# Patient Record
Sex: Male | Born: 1960 | Race: White | Hispanic: No | Marital: Married | Smoking: Current every day smoker
Health system: Southern US, Community
[De-identification: ages and names within clinical notes are randomized; demographics above are authoritative.]

## PROBLEM LIST (undated history)

## (undated) DIAGNOSIS — N529 Male erectile dysfunction, unspecified: Secondary | ICD-10-CM

## (undated) DIAGNOSIS — J339 Nasal polyp, unspecified: Secondary | ICD-10-CM

## (undated) DIAGNOSIS — I10 Essential (primary) hypertension: Secondary | ICD-10-CM

## (undated) DIAGNOSIS — K219 Gastro-esophageal reflux disease without esophagitis: Secondary | ICD-10-CM

## (undated) DIAGNOSIS — D320 Benign neoplasm of cerebral meninges: Secondary | ICD-10-CM

## (undated) HISTORY — DX: Male erectile dysfunction, unspecified: N52.9

## (undated) HISTORY — DX: Essential (primary) hypertension: I10

## (undated) HISTORY — DX: Gastro-esophageal reflux disease without esophagitis: K21.9

## (undated) HISTORY — PX: OTHER SURGICAL HISTORY: SHX169

## (undated) HISTORY — DX: Benign neoplasm of cerebral meninges: D32.0

## (undated) HISTORY — DX: Nasal polyp, unspecified: J33.9

---

## 1998-01-22 ENCOUNTER — Ambulatory Visit (HOSPITAL_COMMUNITY): Admission: RE | Admit: 1998-01-22 | Discharge: 1998-01-22 | Payer: Self-pay | Admitting: *Deleted

## 1998-01-22 ENCOUNTER — Encounter: Payer: Self-pay | Admitting: Ophthalmology

## 1998-10-18 ENCOUNTER — Encounter: Payer: Self-pay | Admitting: Internal Medicine

## 1998-10-18 ENCOUNTER — Ambulatory Visit (HOSPITAL_COMMUNITY): Admission: RE | Admit: 1998-10-18 | Discharge: 1998-10-18 | Payer: Self-pay | Admitting: Internal Medicine

## 1999-05-10 ENCOUNTER — Ambulatory Visit (HOSPITAL_COMMUNITY): Admission: RE | Admit: 1999-05-10 | Discharge: 1999-05-10 | Payer: Self-pay | Admitting: Unknown Physician Specialty

## 1999-05-10 ENCOUNTER — Encounter: Payer: Self-pay | Admitting: Unknown Physician Specialty

## 2000-05-12 ENCOUNTER — Encounter: Payer: Self-pay | Admitting: Unknown Physician Specialty

## 2000-05-12 ENCOUNTER — Ambulatory Visit (HOSPITAL_COMMUNITY): Admission: RE | Admit: 2000-05-12 | Discharge: 2000-05-12 | Payer: Self-pay | Admitting: Unknown Physician Specialty

## 2001-05-11 ENCOUNTER — Ambulatory Visit (HOSPITAL_COMMUNITY): Admission: RE | Admit: 2001-05-11 | Discharge: 2001-05-11 | Payer: Self-pay | Admitting: Unknown Physician Specialty

## 2001-05-11 ENCOUNTER — Encounter: Payer: Self-pay | Admitting: Unknown Physician Specialty

## 2002-10-29 ENCOUNTER — Ambulatory Visit (HOSPITAL_COMMUNITY): Admission: RE | Admit: 2002-10-29 | Discharge: 2002-10-29 | Payer: Self-pay | Admitting: Unknown Physician Specialty

## 2002-10-29 ENCOUNTER — Encounter: Payer: Self-pay | Admitting: Unknown Physician Specialty

## 2003-10-18 ENCOUNTER — Ambulatory Visit (HOSPITAL_COMMUNITY): Admission: RE | Admit: 2003-10-18 | Discharge: 2003-10-18 | Payer: Self-pay | Admitting: Internal Medicine

## 2003-12-31 ENCOUNTER — Ambulatory Visit (HOSPITAL_COMMUNITY): Admission: RE | Admit: 2003-12-31 | Discharge: 2003-12-31 | Payer: Self-pay | Admitting: Internal Medicine

## 2004-03-27 ENCOUNTER — Ambulatory Visit: Payer: Self-pay | Admitting: Internal Medicine

## 2004-05-03 ENCOUNTER — Ambulatory Visit: Payer: Self-pay | Admitting: Internal Medicine

## 2005-01-29 ENCOUNTER — Ambulatory Visit: Payer: Self-pay | Admitting: Internal Medicine

## 2005-02-20 ENCOUNTER — Ambulatory Visit: Payer: Self-pay

## 2005-09-19 ENCOUNTER — Ambulatory Visit: Payer: Self-pay | Admitting: Professional

## 2005-12-19 ENCOUNTER — Ambulatory Visit: Payer: Self-pay | Admitting: Professional

## 2007-01-09 ENCOUNTER — Telehealth: Payer: Self-pay | Admitting: Internal Medicine

## 2007-02-18 ENCOUNTER — Encounter: Payer: Self-pay | Admitting: Internal Medicine

## 2007-07-16 ENCOUNTER — Encounter: Payer: Self-pay | Admitting: Internal Medicine

## 2008-06-09 ENCOUNTER — Telehealth: Payer: Self-pay | Admitting: Internal Medicine

## 2008-07-07 ENCOUNTER — Encounter: Payer: Self-pay | Admitting: Internal Medicine

## 2008-08-16 ENCOUNTER — Ambulatory Visit: Payer: Self-pay | Admitting: Internal Medicine

## 2008-08-16 LAB — CONVERTED CEMR LAB
ALT: 16 units/L (ref 0–53)
AST: 18 units/L (ref 0–37)
Albumin: 4.3 g/dL (ref 3.5–5.2)
Alkaline Phosphatase: 76 units/L (ref 39–117)
BUN: 7 mg/dL (ref 6–23)
Basophils Absolute: 0.1 10*3/uL (ref 0.0–0.1)
Bilirubin, Direct: 0.2 mg/dL (ref 0.0–0.3)
CO2: 30 meq/L (ref 19–32)
Calcium: 9.2 mg/dL (ref 8.4–10.5)
Chloride: 106 meq/L (ref 96–112)
Cholesterol: 216 mg/dL — ABNORMAL HIGH (ref 0–200)
Direct LDL: 123 mg/dL
Eosinophils Absolute: 0.2 10*3/uL (ref 0.0–0.7)
Glucose, Bld: 91 mg/dL (ref 70–99)
HDL: 90.7 mg/dL (ref >39.00)
Hemoglobin: 14.5 g/dL (ref 13.0–17.0)
Ketones, ur: NEGATIVE mg/dL
Lymphs Abs: 1.6 10*3/uL (ref 0.7–4.0)
Neutro Abs: 4 10*3/uL (ref 1.4–7.7)
Nitrite: NEGATIVE
PSA: 0.59 ng/mL (ref 0.10–4.00)
RBC: 4.38 M/uL (ref 4.22–5.81)
TSH: 1.01 microintl units/mL (ref 0.35–5.50)
Total Bilirubin: 1.3 mg/dL — ABNORMAL HIGH (ref 0.3–1.2)
Urobilinogen, UA: 0.2 (ref 0.0–1.0)
WBC: 6.4 10*3/uL (ref 4.5–10.5)

## 2008-08-23 ENCOUNTER — Ambulatory Visit: Payer: Self-pay | Admitting: Internal Medicine

## 2008-09-01 DIAGNOSIS — J330 Polyp of nasal cavity: Secondary | ICD-10-CM | POA: Insufficient documentation

## 2008-09-01 DIAGNOSIS — D320 Benign neoplasm of cerebral meninges: Secondary | ICD-10-CM | POA: Insufficient documentation

## 2009-03-20 ENCOUNTER — Encounter (INDEPENDENT_AMBULATORY_CARE_PROVIDER_SITE_OTHER): Payer: Self-pay | Admitting: *Deleted

## 2009-03-23 ENCOUNTER — Telehealth: Payer: Self-pay | Admitting: Internal Medicine

## 2010-01-17 ENCOUNTER — Encounter (INDEPENDENT_AMBULATORY_CARE_PROVIDER_SITE_OTHER): Payer: Self-pay | Admitting: *Deleted

## 2010-02-05 ENCOUNTER — Encounter (INDEPENDENT_AMBULATORY_CARE_PROVIDER_SITE_OTHER): Payer: Self-pay

## 2010-02-06 ENCOUNTER — Ambulatory Visit: Payer: Self-pay | Admitting: Internal Medicine

## 2010-02-20 ENCOUNTER — Ambulatory Visit: Payer: Self-pay | Admitting: Internal Medicine

## 2010-02-21 ENCOUNTER — Encounter: Payer: Self-pay | Admitting: Internal Medicine

## 2010-04-12 NOTE — Miscellaneous (Signed)
Summary: Lec previsit  Clinical Lists Changes  Medications: Added new medication of MOVIPREP 100 GM  SOLR (PEG-KCL-NACL-NASULF-NA ASC-C) As per prep instructions. - Signed Rx of MOVIPREP 100 GM  SOLR (PEG-KCL-NACL-NASULF-NA ASC-C) As per prep instructions.;  #1 x 0;  Signed;  Entered by: Ulis Rias RN;  Authorized by: Hilarie Fredrickson MD;  Method used: Electronically to [facility] # [address] [place], [place]  [address], Ph: [phone], Fax: [phone] Observations: Added new observation of ALLERGY REV: Done (02/06/2010 10:49)    Prescriptions: MOVIPREP 100 GM  SOLR (PEG-KCL-NACL-NASULF-NA ASC-C) As per prep instructions.  #1 x 0   Entered by:   Ulis Rias RN   Authorized by:   Hilarie Fredrickson MD   Signed by:   Ulis Rias RN on 02/06/2010   Method used:   Electronically to        [facility] # [address]* (retail)       [address]       [place], [place]  [address]       Ph: [phone]       Fax: [phone]   RxID:   [id number]

## 2010-04-12 NOTE — Progress Notes (Signed)
  Phone Note Refill Request Message from:  Fax from Pharmacy on March 23, 2009 2:34 PM  Refills Requested: Medication #1:  NEXIUM 40 MG CPDR 1 qam   Last Refilled: 06/09/2008  Medication #2:  FLONASE 50 MCG/ACT SUSP 2 spr qd.   Last Refilled: 06/09/2008 Initial call taken by: Ami Bullins CMA,  March 23, 2009 2:34 PM    Prescriptions: FLONASE 50 MCG/ACT SUSP (FLUTICASONE PROPIONATE) 2 spr qd  #90 x 3   Entered by:   Ami Bullins CMA   Authorized by:   Jacques Navy MD   Signed by:   Bill Salinas CMA on 03/23/2009   Method used:   Electronically to        [facility]* (mail-order)             ,          Ph: [phone]       Fax: [phone]   RxID:   [id number] NEXIUM 40 MG CPDR (ESOMEPRAZOLE MAGNESIUM) 1 qam  #90 x 3   Entered by:   Bill Salinas CMA   Authorized by:   Jacques Navy MD   Signed by:   Bill Salinas CMA on 03/23/2009   Method used:   Electronically to        [facility]* (mail-order)             ,          Ph: [phone]       Fax: [phone]   RxID:   [id number]

## 2010-04-12 NOTE — Procedures (Signed)
Summary: Colonoscopy  Patient: Exavier Lina Note: All result statuses are Final unless otherwise noted.  Tests: (1) Colonoscopy (COL)   COL Colonoscopy           DONE     [facility]     [address]     [place], [place]  [address]           COLONOSCOPY PROCEDURE REPORT           PATIENT:  Hershy, Flenner  MR#:  [id number]     BIRTHDATE:  September 06, 1960, 49 yrs. old  GENDER:  male     ENDOSCOPIST:  Wilhemina Bonito. Eda Keys, MD     REF. BY:  Rosalyn Gess. Norins, M.D.     PROCEDURE DATE:  02/20/2010     PROCEDURE:  Colonoscopy with snare polypectomy X 3     ASA CLASS:  Class II     INDICATIONS:  Routine Risk Screening     MEDICATIONS:   Fentanyl 100 mcg IV, Versed 10 mg IV, Benadryl 37.5     mg IV           DESCRIPTION OF PROCEDURE:   After the risks benefits and     alternatives of the procedure were thoroughly explained, informed     consent was obtained.  Digital rectal exam was performed and     revealed no abnormalities.   The LB CF-[id number] endoscope     was introduced through the anus and advanced to the cecum, which     was identified by both the appendix and ileocecal valve, without     limitations.TIME TO CECUM = 3:03 MIN.   The quality of the prep     was excellent, using MoviPrep.  The instrument was then slowly     withdrawn (T= 12:12 MIN)as the colon was fully examined.     <<PROCEDUREIMAGES>>           FINDINGS:  Three polyps were found- 3mm in cecum, 5mm in ascending     colon, and 3mm in the sigmoid colon. Polyps were snared without     cautery. Retrieval was successful.  This was otherwise a normal     examination of the colon.   Retroflexed views in the rectum     revealed no abnormalities.    The scope was then withdrawn from     the patient and the procedure completed.           COMPLICATIONS:  None           ENDOSCOPIC IMPRESSION:     1) Three Adenomatous appearring polyps - removed     2) Otherwise normal examination           RECOMMENDATIONS:  1) Follow up colonoscopy in 3 years           ______________________________     Wilhemina Bonito. Eda Keys, MD           CC:  Jacques Navy, MD; The Patient           n.     eSIGNED:   Wilhemina Bonito. Eda Keys at 02/20/2010 10:24 AM           Harlon Flor, [id number]  Note: An exclamation Roberth (!) indicates a result that was not dispersed into the flowsheet. Document Creation Date: 02/20/2010 10:24 AM _______________________________________________________________________  (1) Order result status: Final Collection or observation date-time: 02/20/2010 10:14 Requested date-time:  Receipt date-time:  Reported date-time:  Referring Physician:   Ordering Physician: Fransico Setters [phone]) Specimen Source:  Source: Launa Grill Order Number: [id number] Lab site:

## 2010-04-12 NOTE — Letter (Signed)
Summary: [facility] Instructions  [facility]  [address] [place], [place] [address]   Phone: [phone]  Fax: [phone]       GUSTAVO DISPENZA    1960-07-17    MRN: [id number]        Procedure Day /Date:  02/20/10   Tuesday     Arrival Time: 9:00am      Procedure Time:  10:00am     Location of Procedure:                    _x _  [facility] (4th Floor)                        PREPARATION FOR COLONOSCOPY WITH MOVIPREP   Starting 5 days prior to your procedure _12/8/11 _ do not eat nuts, seeds, popcorn, corn, beans, peas,  salads, or any raw vegetables.  Do not take any fiber supplements (e.g. Metamucil, Citrucel, and Benefiber).  THE DAY BEFORE YOUR PROCEDURE         DATE:  02/19/10   DAY: Monday  1.  Drink clear liquids the entire day-NO SOLID FOOD  2.  Do not drink anything colored red or purple.  Avoid juices with pulp.  No orange juice.  3.  Drink at least 64 oz. (8 glasses) of fluid/clear liquids during the day to prevent dehydration and help the prep work efficiently.  CLEAR LIQUIDS INCLUDE: Water Jello Ice Popsicles Tea (sugar ok, no milk/cream) Powdered fruit flavored drinks Coffee (sugar ok, no milk/cream) Gatorade Juice: apple, white grape, white cranberry  Lemonade Clear bullion, consomm, broth Carbonated beverages (any kind) Strained chicken noodle soup Hard Candy                             4.  In the morning, mix first dose of MoviPrep solution:    Empty 1 Pouch A and 1 Pouch B into the disposable container    Add lukewarm drinking water to the top line of the container. Mix to dissolve    Refrigerate (mixed solution should be used within 24 hrs)  5.  Begin drinking the prep at 5:00 p.m. The MoviPrep container is divided by 4 marks.   Every 15 minutes drink the solution down to the next Toy (approximately 8 oz) until the full liter is complete.   6.  Follow completed prep with 16 oz of clear liquid of your choice  (Nothing red or purple).  Continue to drink clear liquids until bedtime.  7.  Before going to bed, mix second dose of MoviPrep solution:    Empty 1 Pouch A and 1 Pouch B into the disposable container    Add lukewarm drinking water to the top line of the container. Mix to dissolve    Refrigerate  THE DAY OF YOUR PROCEDURE      DATE:  02/20/10  DAY:  Tuesday  Beginning at  5:00 a.m. (5 hours before procedure):         1. Every 15 minutes, drink the solution down to the next [place] (approx 8 oz) until the full liter is complete.  2. Follow completed prep with 16 oz. of clear liquid of your choice.    3. You may drink clear liquids until  8:00am  (2 HOURS BEFORE PROCEDURE).   MEDICATION INSTRUCTIONS  Unless otherwise instructed, you should take regular prescription medications with a small sip  of water   as early as possible the morning of your procedure.         OTHER INSTRUCTIONS  You will need a responsible adult at least 52 years of age to accompany you and drive you home.   This person must remain in the waiting room during your procedure.  Wear loose fitting clothing that is easily removed.  Leave jewelry and other valuables at home.  However, you may wish to bring a book to read or  an iPod/MP3 player to listen to music as you wait for your procedure to start.  Remove all body piercing jewelry and leave at home.  Total time from sign-in until discharge is approximately 2-3 hours.  You should go home directly after your procedure and rest.  You can resume normal activities the  day after your procedure.  The day of your procedure you should not:   Drive   Make legal decisions   Operate machinery   Drink alcohol   Return to work  You will receive specific instructions about eating, activities and medications before you leave.    The above instructions have been reviewed and explained to me by   Ulis Rias RN  February 06, 2010 11:18 AM     I fully  understand and can verbalize these instructions _____________________________ Date _________

## 2010-04-12 NOTE — Letter (Signed)
Summary: Pre Visit Letter Revised  [facility]  [address] [place], [place] [address]   Phone: [phone]  Fax: [phone]      01/17/2010 MRN: [id number]     Colin Watson [address] [place], [place]  [address]   Procedure Date:  02/20/10  Welcome to the Gastroenterology Division at [facility].    You are scheduled to see a nurse for your pre-procedure visit on TUESDAY, 02/06/10 at 11:00 A.M. on the 3rd floor at [facility], [address]  We ask that you try to arrive at our office 15 minutes prior to your appointment time to allow for check-in.  Please take a minute to review the attached form.  If you answer "Yes" to one or more of the questions on the first page, we ask that you call the person listed at your earliest opportunity.  If you answer "No" to all of the questions, please complete the rest of the form and bring it to your appointment.    Your nurse visit will consist of discussing your medical and surgical history, your immediate family medical history, and your medications.   If you are unable to list all of your medications on the form, please bring the medication bottles to your appointment and we will list them.  We will need to be aware of both prescribed and over the counter drugs.  We will need to know exact dosage information as well.    Please be prepared to read and sign documents such as consent forms, a financial agreement, and acknowledgement forms.  If necessary, and with your consent, a friend or relative is welcome to sit-in on the nurse visit with you.  Please bring your insurance card so that we may make a copy of it.  If your insurance requires a referral to see a specialist, please bring your referral form from your primary care physician.  No co-pay is required for this nurse visit.     If you cannot keep your appointment, please call [phone] to cancel or reschedule prior to your appointment date.  This  allows [place] the opportunity to schedule an appointment for another patient in need of care.    Thank you for choosing [facility] for your medical needs.  We appreciate the opportunity to care for you.  Please visit [place] at our website  to learn more about our practice.  Sincerely, The Gastroenterology Division

## 2010-04-12 NOTE — Letter (Signed)
Summary: Patient Notice- Polyp Results  [facility]  [address] [place], [place] [address]   Phone: [phone]  Fax: [phone]        February 21, 2010 MRN: [id number]    [facility] [address] [place], [place]  [address]    Dear Colin Watson,  I am pleased to inform you that the colon polyp(s) removed during your recent colonoscopy was (were) found to be benign (no cancer detected) upon pathologic examination.  I recommend you have a repeat colonoscopy examination in 3 years to look for recurrent polyps, as having colon polyps increases your risk for having recurrent polyps or even colon cancer in the future.  Should you develop new or worsening symptoms of abdominal pain, bowel habit changes or bleeding from the rectum or bowels, please schedule an evaluation with either your primary care physician or with me.  Additional information/recommendations:  __ No further action with gastroenterology is needed at this time. Please      follow-up with your primary care physician for your other healthcare      needs.    Please call us if you are having persistent problems or have questions about your condition that have not been fully answered at this time.  Sincerely,  Colin Fredrickson MD  This letter has been electronically signed by your physician.  Appended Document: Patient Notice- Polyp Results Letter mailed

## 2010-04-12 NOTE — Letter (Signed)
Summary: [facility] Referral (unable to schedule) Notification  [facility]  [address] [place], [place] [address]   Phone: [phone]  Fax: [phone]      March 20, 2009 Colin Watson 01/14/1961 MRN: [id number]   [facility] [address] [place], [place]  [address]   Dear Dr. Debby Bud:   Thank you for your kind referral of the above patient. We have attempted to schedule the recommended Colonoscopy but have been unable to schedule because:  _x_ The patient was not available by phone and/or has not returned our calls.  __ The patient declined to schedule the procedure at this time.  We appreciate the referral and hope that we will have the opportunity to treat this patient in the future.    Sincerely,   [facility]  Vania Rea. Jarold Motto M.D. Hedwig Morton. Juanda Chance M.D. Venita Lick. Russella Dar M.D. Wilhemina Bonito. Marina Goodell M.D. Barbette Hair. Arlyce Dice M.D. Iva Boop M.D. Cheron Every.D.

## 2010-04-16 ENCOUNTER — Encounter: Payer: Self-pay | Admitting: Internal Medicine

## 2010-04-16 ENCOUNTER — Ambulatory Visit (INDEPENDENT_AMBULATORY_CARE_PROVIDER_SITE_OTHER): Payer: BC Managed Care – PPO | Admitting: Internal Medicine

## 2010-04-16 DIAGNOSIS — J209 Acute bronchitis, unspecified: Secondary | ICD-10-CM

## 2010-04-26 NOTE — Assessment & Plan Note (Signed)
Summary: head -- chest cold   Vital Signs:  Patient profile:   50 year old male Height:      71 inches Weight:      201 pounds BMI:     28.14 O2 Sat:      98 % on Room air Temp:     98.1 degrees F oral Pulse rate:   81 / minute BP sitting:   124 / 88  (left arm) Cuff size:   large  Vitals Entered By: Bill Salinas CMA (April 16, 2010 4:52 PM)  O2 Flow:  Room air CC: pt here with c/o head congestion x 3 weeks   Primary Care Provider:  Jacques Navy MD  CC:  pt here with c/o head congestion x 3 weeks.  History of Present Illness: Patient has had a lot of congestion in the chest, did have some sinus congestion but this cleared. Has cough productive colored phlegm with bad taste, greenish color. MIld SOB. No fever, no hard chills. No N/V/D.  Taking Mucinex D and this does help some.   Current Medications (verified): 1)  Nexium 40 Mg Cpdr (Esomeprazole Magnesium) .Marland Kitchen.. 1 Qam 2)  Flonase 50 Mcg/act Susp (Fluticasone Propionate) .... 2 Spr Qd  Allergies (verified): 1)  ! 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  The patient denies anorexia, fever, weight loss, weight gain, hoarseness, chest pain, peripheral edema, abdominal pain, severe indigestion/heartburn, difficulty walking, and unusual weight change.    Physical Exam  General:  alert, well-developed, well-nourished, and well-hydrated.   Head:  normocephalic and atraumatic.  Minimal tenderness to percussion over maxillary sinus Eyes:  C&S clear Ears:  External ear exam shows no significant lesions or deformities.  Otoscopic examination reveals clear canals, tympanic membranes are intact bilaterally without bulging,  retraction, inflammation or discharge. Hearing is grossly normal bilaterally. Mouth:  posterior pharynx is normal Neck:  supple.   Lungs:  normal respiratory effort, normal breath sounds, no crackles, and no wheezes.   Heart:  normal rate and regular rhythm.   Skin:  turgor normal and color normal.   Cervical Nodes:  no anterior cervical adenopathy and no posterior cervical adenopathy.   Psych:  Oriented X3, normally interactive, and good eye contact.     Impression & Recommendations:  Problem # 1:  BRONCHITIS-ACUTE (ICD-466.0) Persistent symptoms for several weeks with a scant amount of thick colored sputum. Suspect mild bronchitis  Plan - doxycycline 100mg  two times a day           supportive care: mucinex palin, or robitussin DM, hydrate, rest, ([place])  His updated medication list for this problem includes:    Doxycycline Hyclate 100 Mg Caps (Doxycycline hyclate) .Marland Kitchen... 1 by mouth two times a day for 7 dyas for uri  Complete Medication List: 1)  Nexium 40 Mg Cpdr (Esomeprazole magnesium) .Marland Kitchen.. 1 qam 2)  Flonase 50 Mcg/act Susp (Fluticasone propionate) .... 2 spr qd 3)  Doxycycline Hyclate 100 Mg Caps (Doxycycline hyclate) .Marland Kitchen.. 1 by mouth two times a day for 7 dyas for uri Prescriptions: DOXYCYCLINE HYCLATE 100 MG CAPS (DOXYCYCLINE HYCLATE) 1 by mouth two times a day for 7 dyas for URI  #14 x 0  Entered and Authorized by:   Jacques Navy MD   Signed by:   Jacques Navy MD on 04/16/2010   Method used:   Electronically to        [facility] # [address] (retail)       [address]       [place], [place]  [address]       Ph: [phone]       Fax: [phone]   RxID:   [id number]    Orders Added: 1)  Est. Patient Level III [84132]

## 2010-07-27 NOTE — Letter (Signed)
[facility]                                BEHAVIORAL MEDICINE   DATE:10/10/2005  TO:Illene Regulus, MD  XB:[id number], [place] E    #[id number]    The above-named patient was referred to behavioral medicine for evaluation  and possible ongoing psychotherapy.  He was seen on September 19, 2005.  Thank  you for your referral and continued interest in behavioral medicine.     Royal Hawthorn, PhD   CG/MedQ  DD:  10/10/2005  DT:  10/10/2005  Job #:  [id number]

## 2010-08-20 ENCOUNTER — Telehealth: Payer: Self-pay | Admitting: *Deleted

## 2010-08-20 MED ORDER — TADALAFIL 5 MG PO TABS: 5.0000 mg | ORAL_TABLET | Freq: Every day | ORAL | Status: DC

## 2010-08-20 NOTE — Telephone Encounter (Signed)
Done,  Patient informed

## 2010-08-20 NOTE — Telephone Encounter (Signed)
Ok for cialis 5mg  sig 1 po qd, #30 refill 5. Need pharmacy

## 2010-08-20 NOTE — Telephone Encounter (Signed)
Pt left vm, he sent MD email and has not heard back. I advised pt that med requests are best handled thru triage. He is req rx for cialis daily.

## 2010-10-19 ENCOUNTER — Other Ambulatory Visit: Payer: Self-pay

## 2010-10-19 MED ORDER — TADALAFIL 5 MG PO TABS: 5.0000 mg | ORAL_TABLET | Freq: Every day | ORAL | Status: DC

## 2010-10-19 NOTE — Telephone Encounter (Signed)
Per pt [facility] Rx need to be filled through mail order company

## 2010-12-27 ENCOUNTER — Other Ambulatory Visit: Payer: Self-pay | Admitting: Internal Medicine

## 2011-01-12 ENCOUNTER — Other Ambulatory Visit: Payer: Self-pay | Admitting: Internal Medicine

## 2011-06-18 ENCOUNTER — Other Ambulatory Visit: Payer: Self-pay | Admitting: Internal Medicine

## 2011-12-02 ENCOUNTER — Other Ambulatory Visit (INDEPENDENT_AMBULATORY_CARE_PROVIDER_SITE_OTHER): Payer: BC Managed Care – PPO

## 2011-12-02 ENCOUNTER — Encounter: Payer: Self-pay | Admitting: Internal Medicine

## 2011-12-02 ENCOUNTER — Ambulatory Visit (INDEPENDENT_AMBULATORY_CARE_PROVIDER_SITE_OTHER): Payer: BC Managed Care – PPO | Admitting: Internal Medicine

## 2011-12-02 VITALS — BP 162/100 | HR 79 | Temp 97.4°F | Resp 16 | Wt 190.0 lb

## 2011-12-02 DIAGNOSIS — D320 Benign neoplasm of cerebral meninges: Secondary | ICD-10-CM

## 2011-12-02 DIAGNOSIS — N529 Male erectile dysfunction, unspecified: Secondary | ICD-10-CM

## 2011-12-02 DIAGNOSIS — Z0000 Encounter for general adult medical examination without abnormal findings: Secondary | ICD-10-CM

## 2011-12-02 MED ORDER — TADALAFIL 20 MG PO TABS: 20.0000 mg | ORAL_TABLET | ORAL | Status: DC | PRN

## 2011-12-02 NOTE — Patient Instructions (Addendum)
Normal exam. You are good for two years and prescriptions will be refilled in the interval.  You will receive a copy of the report but you may be able to access your labs on MyChart.

## 2011-12-03 ENCOUNTER — Encounter: Payer: Self-pay | Admitting: Internal Medicine

## 2011-12-03 DIAGNOSIS — N529 Male erectile dysfunction, unspecified: Secondary | ICD-10-CM | POA: Insufficient documentation

## 2011-12-03 DIAGNOSIS — Z0000 Encounter for general adult medical examination without abnormal findings: Secondary | ICD-10-CM | POA: Insufficient documentation

## 2011-12-03 LAB — COMPREHENSIVE METABOLIC PANEL
ALT: 21 U/L (ref 0–53)
Chloride: 101 mEq/L (ref 96–112)
GFR: 75.19 mL/min (ref >60.00)
Glucose, Bld: 87 mg/dL (ref 70–99)
Total Protein: 7.4 g/dL (ref 6.0–8.3)

## 2011-12-03 LAB — LDL CHOLESTEROL, DIRECT: Direct LDL: 110.3 mg/dL

## 2011-12-03 LAB — LIPID PANEL
HDL: 98.6 mg/dL (ref >39.00)
Total CHOL/HDL Ratio: 2
Triglycerides: 89 mg/dL (ref 0.0–149.0)

## 2011-12-03 NOTE — Progress Notes (Signed)
Subjective:    Patient ID: Colin Watson, male    DOB: 07/15/1960, 51 y.o.   MRN: [id number]  HPI Mr.Obyrne presents for annual medical exam and follow-up. He was last seen February '12. In the interval he has been doing well: no major illness, surgery or injury. He has followed at [facility] for meningioma with serial MRI scans and has been stable. He has seen his dentist, he is current with ophthalmology. He remains fit, exercising on a regular basis. He has not medical complaints.  Past Medical History  Diagnosis Date  . 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 Followed at [facility]  . Nasal polyp    Past Surgical History  Procedure Date  . Cyst excision,wrist    Family History  Problem Relation Age of Onset  . COPD Neg Hx   . Diabetes Neg Hx   . Heart disease Neg Hx   . Cancer Neg Hx    History   Social History  . Marital Status: Married    Spouse Name: N/A    Number of Children: 2  . Years of Education: 16   Occupational History  . ACCOUNTANT    Social History Main Topics  . Smoking status: Current Every Day Smoker -- 1.0 packs/day for 33 years    Types: Cigarettes  . Smokeless tobacco: Never Used   Comment: discussed approach to smoking cessation and behavioral change  . Alcohol Use: Yes  . Drug Use: No  . Sexually Active: Yes -- Male partner(s)   Other Topics Concern  . Not on file   Social History Narrative   HSG, [employer] - BA accounting. Married '82. 1 daughter, 1 son. 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 Marriage in good health    Current Outpatient Prescriptions on File Prior to Visit  Medication Sig Dispense Refill  . fluticasone (FLONASE) 50 MCG/ACT nasal spray USE 2 SPRAYS DAILY AS DIRECTED  3 g  5  . NEXIUM 40 MG capsule TAKE 1 CAPSULE EVERY MORNING  90 capsule  0  . tadalafil (CIALIS) 20 MG tablet Take 1 tablet (20 mg total) by mouth every other day as needed for erectile  dysfunction.  12 tablet  3             Review of Systems Constitutional:  Negative for fever, chills, activity change and unexpected weight change.  HEENT:  Negative for hearing loss, ear pain, congestion, neck stiffness and postnasal drip. Negative for sore throat or swallowing problems. Negative for dental complaints.   Eyes: Negative for vision loss or change in visual acuity.  Respiratory: Negative for chest tightness and wheezing. Negative for DOE.   Cardiovascular: Negative for chest pain or palpitations. No decreased exercise tolerance Gastrointestinal: No change in bowel habit. No bloating or gas. No reflux or indigestion Genitourinary: Negative for urgency, frequency, flank pain and difficulty urinating.  Musculoskeletal: Negative for myalgias, back pain, arthralgias and gait problem.  Neurological: Negative for dizziness, tremors, weakness and headaches.  Hematological: Negative for adenopathy.  Psychiatric/Behavioral: Negative for behavioral problems and dysphoric mood.       Objective:   Physical Exam Filed Vitals:   12/02/11 1510  BP: 162/100  Pulse: 79  Temp: 97.4 F (36.3 C)  Resp: 16   Wt Readings from Last 3 Encounters:  12/02/11 190 lb (86.183 kg)  04/16/10 201 lb (91.173 kg)  08/23/08 187 lb 8 oz (85.049 kg)   Gen'l:  Well nourished well developed white male in no acute distress  HEENT: Head: Normocephalic and atraumatic. Right Ear: External ear normal. EAC/TM nl. Left Ear: External ear normal.  EAC/TM nl. Nose: Nose normal. Mouth/Throat: Oropharynx is clear and moist. Dentition - native, in good repair. No buccal or palatal lesions. Posterior pharynx clear. Eyes: Conjunctivae and sclera clear. EOM intact. Right pupil opacified - blind OD. Left pupil round, and reactive to light.  Left eye exhibits no discharge. Neck: Normal range of motion. Neck supple. No JVD present. No tracheal deviation present. No thyromegaly present.  Cardiovascular: Normal rate,  regular rhythm, no gallop, no friction rub, no murmur heard.      Quiet precordium. 2+ radial and DP pulses . No carotid bruits Pulmonary/Chest: Effort normal. No respiratory distress or increased WOB, no wheezes, no rales. No chest wall deformity or CVAT. Abdomen: Soft. Bowel sounds are normal in all quadrants. He exhibits no distension, no tenderness, no rebound or guarding, No heptosplenomegaly  Genitourinary: deferred  Musculoskeletal: Normal range of motion. He exhibits no edema and no tenderness.       Small and large joints without redness, synovial thickening or deformity. Full range of motion preserved about all small, median and large joints.  Lymphadenopathy:    He has no cervical or supraclavicular adenopathy.  Neurological: He is alert and oriented to person, place, and time. CN II-XII intact except for blind in right eye. DTRs 2+ and symmetrical biceps, radial and patellar tendons. Cerebellar function normal with no tremor, rigidity, normal gait and station.  Skin: Skin is warm and dry. No rash noted. No erythema.  Psychiatric: He has a normal mood and affect. His behavior is normal. Thought content normal.   Routine labwork pending       Assessment & Plan:

## 2011-12-03 NOTE — Assessment & Plan Note (Signed)
Mild ED - responds to medication  Plan - Rx Cialis 20 mg prn

## 2011-12-03 NOTE — Assessment & Plan Note (Signed)
Optic nerve right. Followed at [facility] - stable for several years based on MRI scans.

## 2011-12-03 NOTE — Assessment & Plan Note (Signed)
Interval history is benign. Physical exam is normal sans genitalia/prostate exam. Lab is pending but reviewed lab '10 - excellent with normal LDL cholesterol, very robust HDL cholesterol, normal glucose and PSA 0.59. He is current with colorectal cancer screening and due for follow-up in '14. Immunizations - he had full immunization [place] to travel abroad - he will forward immunization record. Discussed pros and cons of prostate cancer screening (USPHCTF recommendations reviewed and ACU April '13 recommendations) and he defers evaluation at this time.  In summary - a very nice man who appears to be healthy with a stable optic nerve sheath meningioma. He does invest in his health with regular exercise. He is advised to return in 2 years for exam and may have medication refills w/o OV in the interval.

## 2012-01-16 ENCOUNTER — Other Ambulatory Visit: Payer: Self-pay | Admitting: Internal Medicine

## 2012-02-19 ENCOUNTER — Other Ambulatory Visit: Payer: Self-pay

## 2012-02-19 MED ORDER — FLUTICASONE PROPIONATE 50 MCG/ACT NA SUSP: 2.0000 | Freq: Every day | NASAL | Status: DC

## 2012-12-30 ENCOUNTER — Encounter: Payer: Self-pay | Admitting: Internal Medicine

## 2013-01-01 ENCOUNTER — Other Ambulatory Visit: Payer: Self-pay | Admitting: Internal Medicine

## 2013-03-26 ENCOUNTER — Other Ambulatory Visit: Payer: Self-pay | Admitting: Internal Medicine

## 2013-08-07 ENCOUNTER — Encounter: Payer: Self-pay | Admitting: Internal Medicine

## 2013-09-17 ENCOUNTER — Other Ambulatory Visit: Payer: Self-pay | Admitting: Dermatology

## 2013-09-23 ENCOUNTER — Ambulatory Visit (INDEPENDENT_AMBULATORY_CARE_PROVIDER_SITE_OTHER): Payer: BC Managed Care – PPO | Admitting: Internal Medicine

## 2013-09-23 ENCOUNTER — Encounter: Payer: Self-pay | Admitting: Internal Medicine

## 2013-09-23 VITALS — BP 185/101 | HR 84 | Temp 98.2°F | Wt 206.0 lb

## 2013-09-23 DIAGNOSIS — I10 Essential (primary) hypertension: Secondary | ICD-10-CM

## 2013-09-23 DIAGNOSIS — K219 Gastro-esophageal reflux disease without esophagitis: Secondary | ICD-10-CM | POA: Insufficient documentation

## 2013-09-23 DIAGNOSIS — IMO0001 Reserved for inherently not codable concepts without codable children: Secondary | ICD-10-CM

## 2013-09-23 DIAGNOSIS — R030 Elevated blood-pressure reading, without diagnosis of hypertension: Secondary | ICD-10-CM

## 2013-09-23 DIAGNOSIS — N529 Male erectile dysfunction, unspecified: Secondary | ICD-10-CM

## 2013-09-23 HISTORY — DX: Essential (primary) hypertension: I10

## 2013-09-23 MED ORDER — ESOMEPRAZOLE MAGNESIUM 40 MG PO CPDR: DELAYED_RELEASE_CAPSULE | ORAL | Status: DC

## 2013-09-23 MED ORDER — FLUTICASONE PROPIONATE 50 MCG/ACT NA SUSP
NASAL | Status: DC
Start: 2013-09-23 — End: 2014-10-18

## 2013-09-23 MED ORDER — TADALAFIL 20 MG PO TABS: 20.0000 mg | ORAL_TABLET | ORAL | Status: DC | PRN

## 2013-09-23 NOTE — Assessment & Plan Note (Signed)
BP noted to be elevated today and in 2013. He is asymptomatic Discussed labs and medications but states he had labs at work yearly . Eventually agreed  on: Patient to bring lab results from work Monitor BPs, call with readings in 2 weeks, medication? Low-salt diet, exercise

## 2013-09-23 NOTE — Patient Instructions (Signed)
Check the  blood pressure 2 or 3 times a   week be sure it is between 110/60 and 140/85. Ideal blood pressure is 120/80. Call in 2 weeks with your BP readings  Low salt diet! Exercise!

## 2013-09-23 NOTE — Assessment & Plan Note (Signed)
RF cialis

## 2013-09-23 NOTE — Progress Notes (Signed)
   Subjective:    Patient ID: Colin Watson, male    DOB: May 24, 1960, 53 y.o.   MRN: [id number]  DOS:  09/23/2013 Type of visit - description: transferring , Dr Linda Hedges, acute visit for Rx refill History: History of GERD, as long as he takes PPIs he is asymptomatic ED, needs a refill of Cialis BP was noted to be elevated, we rechecked manually and was still elevated. No recent ambulatory BPs although he has "physicals" at work and has not been told that his blood pressures is high  ROS Denies chest pain, difficulty breathing or lower extremity edema No nausea, vomiting, diarrhea. No dysphasia or odynophagia Takes Motrin sometimes but no more than 2 or 3 tablets  a week   Past Medical History  Diagnosis Date  . Meningioma of optic nerve sheath     loss of vision right eye. s/p XRT, f/u at [facility]    . Nasal polyp   . GERD (gastroesophageal reflux disease)   . Erectile dysfunction     Past Surgical History  Procedure Laterality Date  . Cyst excision,wrist      History   Social History  . Marital Status: Married    Spouse Name: N/A    Number of Children: 2  . Years of Education: 16   Occupational History  . ACCOUNTANT    Social History Main Topics  . Smoking status: Current Every Day Smoker -- 1.00 packs/day for 33 years    Types: Cigarettes  . Smokeless tobacco: Never Used     Comment: discussed approach to smoking cessation and behavioral change  . Alcohol Use: Yes  . Drug Use: No  . Sexual Activity: Yes    Partners: Female   Other Topics Concern  . Not on file   Social History Narrative   HSG, [place] - [employer]. Married '82. 1 daughter, 1 son. 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 Marriage in good health        Medication List       This list is accurate as of: 09/23/13  5:21 PM.  Always use your most recent med list.               esomeprazole 40 MG capsule  Commonly known as:  [place] 1 CAPSULE EVERY MORNING  (OVER DUE FOR YEARLY PHYSICAL. MUST SEE DOCTOR FOR FURTHER REFILLS OF MED)     fluticasone 50 MCG/ACT nasal spray  Commonly known as:  FLONASE  USE 2 SPRAYS NASALLY DAILY AS DIRECTED     tadalafil 20 MG tablet  Commonly known as:  CIALIS  Take 1 tablet (20 mg total) by mouth every other day as needed for erectile dysfunction.           Objective:   Physical Exam BP 185/101  Pulse 84  Temp(Src) 98.2 F (36.8 C)  Wt 206 lb (93.441 kg)  SpO2 94% General -- alert, well-developed, NAD.  Neck --no thyromegaly  HEENT-- Not pale.  Lungs -- normal respiratory effort, no intercostal retractions, no accessory muscle use, and normal breath sounds.  Heart-- normal rate, regular rhythm, no murmur.  Extremities-- no pretibial edema bilaterally  Neurologic--  alert & oriented X3. Speech normal, gait appropriate for age, strength symmetric and appropriate for age.  Psych-- Cognition and judgment appear intact. Cooperative with normal attention span and concentration. No anxious or depressed appearing.      Assessment & Plan:

## 2013-09-23 NOTE — Assessment & Plan Note (Signed)
asx , RF PPI

## 2013-09-23 NOTE — Progress Notes (Signed)
Pre visit review using our clinic review tool, if applicable. No additional management support is needed unless otherwise documented below in the visit note. 

## 2013-09-24 ENCOUNTER — Telehealth: Payer: Self-pay | Admitting: Internal Medicine

## 2013-09-24 NOTE — Telephone Encounter (Signed)
Relevant patient education assigned to patient using Emmi. ° °

## 2013-10-11 ENCOUNTER — Telehealth: Payer: Self-pay | Admitting: Internal Medicine

## 2013-10-11 DIAGNOSIS — I10 Essential (primary) hypertension: Secondary | ICD-10-CM

## 2013-10-11 NOTE — Telephone Encounter (Signed)
Call pt, please find out his ambulatory BP readings

## 2013-10-11 NOTE — Telephone Encounter (Signed)
lmovm

## 2013-10-13 ENCOUNTER — Telehealth: Payer: Self-pay | Admitting: *Deleted

## 2013-10-13 NOTE — Telephone Encounter (Signed)
Received approval letter via mail from [employer].

## 2013-10-14 NOTE — Telephone Encounter (Signed)
LMOM @ (8:33am) asking the pt to RTC regarding approval letter for meds.//AB/CMA

## 2013-10-18 NOTE — Telephone Encounter (Signed)
LMOVM

## 2013-10-19 ENCOUNTER — Other Ambulatory Visit (INDEPENDENT_AMBULATORY_CARE_PROVIDER_SITE_OTHER): Payer: BC Managed Care – PPO

## 2013-10-19 DIAGNOSIS — I10 Essential (primary) hypertension: Secondary | ICD-10-CM

## 2013-10-19 MED ORDER — LOSARTAN POTASSIUM 100 MG PO TABS: 100.0000 mg | ORAL_TABLET | Freq: Every day | ORAL | Status: DC

## 2013-10-19 NOTE — Telephone Encounter (Signed)
Advise patient: BPs very high. Come back today or tomorrow for labs: BMP, CBC, TSH ---dx hypertension As soon as he gets his blood work start losartan 100 mg #30 no refills Do not   start medication before labs . Followup in 2  weeks

## 2013-10-19 NOTE — Telephone Encounter (Signed)
Pt returned call and I informed him that his Cialis was approved.  Pt stated that he was informed by his insurance company.  Cialis approved effective (09/05/2013 through 10/04/2016). //AB/CMA

## 2013-10-19 NOTE — Addendum Note (Signed)
Addended by: Wilfrid Lund on: 10/19/2013 02:55 PM   Modules accepted: Orders

## 2013-10-19 NOTE — Telephone Encounter (Signed)
Spoke with Pt, lab appt scheduled for 10/19/2013 at 3:45, will send Rx to [facility].

## 2013-10-19 NOTE — Telephone Encounter (Signed)
Pt states was on vacation past couple weeks pt stats its been averaging in the 190/110's . Pt states he took it today and it was 205/115.

## 2013-10-20 LAB — BASIC METABOLIC PANEL
BUN: 7 mg/dL (ref 6–23)
CALCIUM: 10.3 mg/dL (ref 8.4–10.5)
CO2: 28 meq/L (ref 19–32)
Chloride: 101 mEq/L (ref 96–112)
Creatinine, Ser: 0.8 mg/dL (ref 0.4–1.5)
GFR: 100.85 mL/min (ref >60.00)
GLUCOSE: 86 mg/dL (ref 70–99)
Potassium: 4 mEq/L (ref 3.5–5.1)
Sodium: 138 mEq/L (ref 135–145)

## 2013-10-20 LAB — CBC WITH DIFFERENTIAL/PLATELET
BASOS ABS: 0 10*3/uL (ref 0.0–0.1)
Basophils Relative: 0.4 % (ref 0.0–3.0)
Eosinophils Absolute: 0.2 10*3/uL (ref 0.0–0.7)
Eosinophils Relative: 2.7 % (ref 0.0–5.0)
HCT: 41.6 % (ref 39.0–52.0)
Hemoglobin: 14.1 g/dL (ref 13.0–17.0)
Lymphocytes Relative: 18.4 % (ref 12.0–46.0)
Lymphs Abs: 1.1 10*3/uL (ref 0.7–4.0)
MCHC: 33.8 g/dL (ref 30.0–36.0)
MCV: 96.3 fl (ref 78.0–100.0)
MONOS PCT: 8.2 % (ref 3.0–12.0)
Monocytes Absolute: 0.5 10*3/uL (ref 0.1–1.0)
Neutro Abs: 4.2 10*3/uL (ref 1.4–7.7)
Neutrophils Relative %: 70.3 % (ref 43.0–77.0)
PLATELETS: 299 10*3/uL (ref 150.0–400.0)
RBC: 4.32 Mil/uL (ref 4.22–5.81)
RDW: 13.6 % (ref 11.5–15.5)
WBC: 5.9 10*3/uL (ref 4.0–10.5)

## 2013-10-20 LAB — TSH: TSH: 0.4 u[IU]/mL (ref 0.35–4.50)

## 2013-11-02 ENCOUNTER — Encounter: Payer: Self-pay | Admitting: Internal Medicine

## 2013-11-02 ENCOUNTER — Ambulatory Visit (INDEPENDENT_AMBULATORY_CARE_PROVIDER_SITE_OTHER): Payer: BC Managed Care – PPO | Admitting: Internal Medicine

## 2013-11-02 VITALS — BP 180/82 | HR 79 | Temp 98.2°F | Wt 205.2 lb

## 2013-11-02 DIAGNOSIS — D320 Benign neoplasm of cerebral meninges: Secondary | ICD-10-CM

## 2013-11-02 DIAGNOSIS — I10 Essential (primary) hypertension: Secondary | ICD-10-CM

## 2013-11-02 MED ORDER — AMLODIPINE BESYLATE 10 MG PO TABS: 10.0000 mg | ORAL_TABLET | Freq: Every day | ORAL | Status: DC

## 2013-11-02 MED ORDER — LOSARTAN POTASSIUM 100 MG PO TABS: 100.0000 mg | ORAL_TABLET | Freq: Every day | ORAL | Status: DC

## 2013-11-02 NOTE — Assessment & Plan Note (Addendum)
Since the last time he was here, BMP, CBC and TSH were normal, he start taking losartan, good compliance . no apparent side effects. BP continued to be elevated. He remains asymptomatic. Plan: Add amlodipine BMP today Followup next month Continue with healthier lifestyle. If not controlled with 2 medications, consider further eval

## 2013-11-02 NOTE — Patient Instructions (Addendum)
Get your blood work before you leave  See you next month  Continue check the  blood pressure 2 or 3 times a  week be sure it is between 110/60 and 140/85. Ideal blood pressure is 120/80. If it is consistently higher or lower, let me know

## 2013-11-02 NOTE — Progress Notes (Signed)
Pre-visit discussion using our clinic review tool. No additional management support is needed unless otherwise documented below in the visit note.  

## 2013-11-02 NOTE — Progress Notes (Signed)
Subjective:    Patient ID: Colin Watson, male    DOB: [birth year], 53 y.o.   MRN: [id number]  DOS:  11/02/2013 Type of visit - description: Followup from previous visit History: Since the last time he was here, he is taking losartan, good compliance and tolerance. Ambulatory BPs continue to be elevated, as high as 200/100.   ROS Denies chest pain, difficulty breathing, cough or headaches. Has improved his diet, he continued to be very active playing golf and taking walks. Take Motrin from time to time, no more frequent than 3 times a week. Has lost some weight  Past Medical History  Diagnosis Date  . Meningioma of optic nerve sheath     loss of vision right eye. s/p XRT, f/u at [facility]    . Nasal polyp   . GERD (gastroesophageal reflux disease)   . Erectile dysfunction   . Hypertension 09/23/2013    Past Surgical History  Procedure Laterality Date  . Cyst excision,wrist      History   Social History  . Marital Status: Married    Spouse Name: N/A    Number of Children: 2  . Years of Education: 16   Occupational History  . ACCOUNTANT    Social History Main Topics  . Smoking status: Current Every Day Smoker -- 1.00 packs/day for 33 years    Types: Cigarettes  . Smokeless tobacco: Never Used     Comment: discussed approach to smoking cessation and behavioral change  . Alcohol Use: Yes  . Drug Use: No  . Sexual Activity: Yes    Partners: Female   Other Topics Concern  . Not on file   Social History Narrative   HSG, [place] - [employer]. Married '82. 1 daughter, 1 son. 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 Marriage in good health        Medication List       This list is accurate as of: 11/02/13 11:59 PM.  Always use your most recent med list.               amLODipine 10 MG tablet  Commonly known as:  NORVASC  Take 1 tablet (10 mg total) by mouth daily.     esomeprazole 40 MG capsule  Commonly known as:  NEXIUM  TAKE  1 CAPSULE EVERY MORNING (OVER DUE FOR YEARLY PHYSICAL. MUST SEE DOCTOR FOR FURTHER REFILLS OF MED)     fluticasone 50 MCG/ACT nasal spray  Commonly known as:  FLONASE  USE 2 SPRAYS NASALLY DAILY AS DIRECTED     losartan 100 MG tablet  Commonly known as:  COZAAR  Take 1 tablet (100 mg total) by mouth daily.     tadalafil 20 MG tablet  Commonly known as:  CIALIS  Take 1 tablet (20 mg total) by mouth every other day as needed for erectile dysfunction.           Objective:   Physical Exam BP 180/82  Pulse 79  Temp(Src) 98.2 F (36.8 C) (Oral)  Wt 205 lb 4 oz (93.101 kg)  SpO2 98%  General -- alert, well-developed, NAD.  Lungs -- normal respiratory effort, no intercostal retractions, no accessory muscle use, and normal breath sounds.  Heart-- normal rate, regular rhythm, no murmur.  Extremities-- no pretibial edema bilaterally  Neurologic--  alert & oriented X3. Speech normal, gait appropriate for age, strength symmetric and appropriate for age.  Psych-- Cognition and judgment appear intact. Cooperative with normal attention  span and concentration. No anxious or depressed appearing.   Assessment & Plan:

## 2013-11-03 ENCOUNTER — Telehealth: Payer: Self-pay | Admitting: Internal Medicine

## 2013-11-03 LAB — BASIC METABOLIC PANEL
BUN: 8 mg/dL (ref 6–23)
CALCIUM: 9.4 mg/dL (ref 8.4–10.5)
CHLORIDE: 99 meq/L (ref 96–112)
CO2: 26 meq/L (ref 19–32)
CREATININE: 1 mg/dL (ref 0.4–1.5)
GFR: 87.49 mL/min (ref >60.00)
Glucose, Bld: 88 mg/dL (ref 70–99)
Potassium: 4.4 mEq/L (ref 3.5–5.1)
Sodium: 136 mEq/L (ref 135–145)

## 2013-11-03 NOTE — Telephone Encounter (Signed)
Relevant patient education assigned to patient using Emmi. ° °

## 2013-11-29 ENCOUNTER — Ambulatory Visit (INDEPENDENT_AMBULATORY_CARE_PROVIDER_SITE_OTHER): Payer: BC Managed Care – PPO | Admitting: Internal Medicine

## 2013-11-29 ENCOUNTER — Encounter: Payer: Self-pay | Admitting: Internal Medicine

## 2013-11-29 VITALS — BP 132/72 | HR 96 | Temp 98.6°F | Ht 71.0 in | Wt 202.5 lb

## 2013-11-29 DIAGNOSIS — I10 Essential (primary) hypertension: Secondary | ICD-10-CM

## 2013-11-29 DIAGNOSIS — Z0000 Encounter for general adult medical examination without abnormal findings: Secondary | ICD-10-CM

## 2013-11-29 DIAGNOSIS — Z720 Tobacco use: Secondary | ICD-10-CM | POA: Insufficient documentation

## 2013-11-29 DIAGNOSIS — F17200 Nicotine dependence, unspecified, uncomplicated: Secondary | ICD-10-CM

## 2013-11-29 NOTE — Assessment & Plan Note (Signed)
Patient continue smoking, risks associated with tobacco discussed. Encouraged to think about quitting. Discuss Wellbutrin, Chantix will call when ready to try meds

## 2013-11-29 NOTE — Progress Notes (Signed)
Pre visit review using our clinic review tool, if applicable. No additional management support is needed unless otherwise documented below in the visit note. 

## 2013-11-29 NOTE — Patient Instructions (Signed)
Check the  blood pressure 2 or 3 times a   week be sure it is between 110/60 and 140/85. Ideal blood pressure is 120/80. If it is consistently higher or lower, let me know  Call your pharmacy when you need a refill   Please come back to the office in 4-5 months for a physical exam. Come back fasting    Stop by the front desk and schedule the visit      Smoking Cessation Quitting smoking is important to your health and has many advantages. However, it is not always easy to quit since nicotine is a very addictive drug. Oftentimes, people try 3 times or more before being able to quit. This document explains the best ways for you to prepare to quit smoking. Quitting takes hard work and a lot of effort, but you can do it. ADVANTAGES OF QUITTING SMOKING  You will live longer, feel better, and live better.  Your body will feel the impact of quitting smoking almost immediately.  Within 20 minutes, blood pressure decreases. Your pulse returns to its normal level.  After 8 hours, carbon monoxide levels in the blood return to normal. Your oxygen level increases.  After 24 hours, the chance of having a heart attack starts to decrease. Your breath, hair, and body stop smelling like smoke.  After 48 hours, damaged nerve endings begin to recover. Your sense of taste and smell improve.  After 72 hours, the body is virtually free of nicotine. Your bronchial tubes relax and breathing becomes easier.  After 2 to 12 weeks, lungs can hold more air. Exercise becomes easier and circulation improves.  The risk of having a heart attack, stroke, cancer, or lung disease is greatly reduced.  After 1 year, the risk of coronary heart disease is cut in half.  After 5 years, the risk of stroke falls to the same as a nonsmoker.  After 10 years, the risk of lung cancer is cut in half and the risk of other cancers decreases significantly.  After 15 years, the risk of coronary heart disease drops, usually to the  level of a nonsmoker.  If you are pregnant, quitting smoking will improve your chances of having a healthy baby.  The people you live with, especially any children, will be healthier.  You will have extra money to spend on things other than cigarettes. QUESTIONS TO THINK ABOUT BEFORE ATTEMPTING TO QUIT You may want to talk about your answers with your health care provider.  Why do you want to quit?  If you tried to quit in the past, what helped and what did not?  What will be the most difficult situations for you after you quit? How will you plan to handle them?  Who can help you through the tough times? Your family? Friends? A health care provider?  What pleasures do you get from smoking? What ways can you still get pleasure if you quit? Here are some questions to ask your health care provider:  How can you help me to be successful at quitting?  What medicine do you think would be best for me and how should I take it?  What should I do if I need more help?  What is smoking withdrawal like? How can I get information on withdrawal? GET READY  Set a quit date.  Change your environment by getting rid of all cigarettes, ashtrays, matches, and lighters in your home, car, or work. Do not let people smoke in your home.  Review your past attempts to quit. Think about what worked and what did not. GET SUPPORT AND ENCOURAGEMENT You have a better chance of being successful if you have help. You can get support in many ways.  Tell your family, friends, and coworkers that you are going to quit and need their support. Ask them not to smoke around you.  Get individual, group, or telephone counseling and support. Programs are available at [employer] and health centers. Call your local health department for information about programs in your area.  Spiritual beliefs and practices may help some smokers quit.  Download a "quit meter" on your computer to keep track of quit statistics,  such as how long you have gone without smoking, cigarettes not smoked, and money saved.  Get a self-help book about quitting smoking and staying off tobacco. [place] yourself from urges to smoke. Talk to someone, go for a walk, or occupy your time with a task.  Change your normal routine. Take a different route to work. Drink tea instead of coffee. Eat breakfast in a different place.  Reduce your stress. Take a hot bath, exercise, or read a book.  Plan something enjoyable to do every day. Reward yourself for not smoking.  Explore interactive web-based programs that specialize in helping you quit. GET MEDICINE AND USE IT CORRECTLY Medicines can help you stop smoking and decrease the urge to smoke. Combining medicine with the above behavioral methods and support can greatly increase your chances of successfully quitting smoking.  Nicotine replacement therapy helps deliver nicotine to your body without the negative effects and risks of smoking. Nicotine replacement therapy includes nicotine gum, lozenges, inhalers, nasal sprays, and skin patches. Some may be available over-the-counter and others require a prescription.  Antidepressant medicine helps people abstain from smoking, but how this works is unknown. This medicine is available by prescription.  Nicotinic receptor partial agonist medicine simulates the effect of nicotine in your brain. This medicine is available by prescription. Ask your health care provider for advice about which medicines to use and how to use them based on your health history. Your health care provider will tell you what side effects to look out for if you choose to be on a medicine or therapy. Carefully read the information on the package. Do not use any other product containing nicotine while using a nicotine replacement product.  RELAPSE OR DIFFICULT SITUATIONS Most relapses occur within the first 3 months after quitting. Do not be  discouraged if you start smoking again. Remember, most people try several times before finally quitting. You may have symptoms of withdrawal because your body is used to nicotine. You may crave cigarettes, be irritable, feel very hungry, cough often, get headaches, or have difficulty concentrating. The withdrawal symptoms are only temporary. They are strongest when you first quit, but they will go away within 10-14 days. To reduce the chances of relapse, try to:  Avoid drinking alcohol. Drinking lowers your chances of successfully quitting.  Reduce the amount of caffeine you consume. Once you quit smoking, the amount of caffeine in your body increases and can give you symptoms, such as a rapid heartbeat, sweating, and anxiety.  Avoid smokers because they can make you want to smoke.  Do not let weight gain distract you. Many smokers will gain weight when they quit, usually less than 10 pounds. Eat a healthy diet and stay active. You can always lose the weight gained after you quit.  Find  ways to improve your mood other than smoking. FOR MORE INFORMATION  www.smokefree.gov  Document Released: 02/19/2001 Document Revised: 07/12/2013 Document Reviewed: 06/06/2011 [facility] Patient Information 2015 [place], [place]. This information is not intended to replace advice given to you by your health care provider. Make sure you discuss any questions you have with your health care provider.

## 2013-11-29 NOTE — Assessment & Plan Note (Signed)
Although we are not doing a physical, we got the following information: Td 2008 per patient Flu shot, we'll get at work Colonoscopy in 2011, due for another colonoscopy, recommend to call GI, here already got a letter

## 2013-11-29 NOTE — Assessment & Plan Note (Signed)
Ambulatory BPs decreasing, BP today is great. Plan: Continue with present care, check ambulatory BPs, see instructions Continue with low salt diet and exercise.

## 2013-11-29 NOTE — Progress Notes (Signed)
Subjective:    Patient ID: Colin Watson, male    DOB: [birth year], 53 y.o.   MRN: [id number]  DOS:  11/29/2013 Type of visit - description : f/u Interval history: Good med compliance, no apparent s/e  amb BPs decreasing, has checked few times, few normal readings, last BP in the 150s Still smoking   ROS Denies lower extremity edema. No constipation, cough, sputum production. Following a low-salt diet. Exercising 3 to 4 times a week ---> walks 1 mile   Past Medical History  Diagnosis Date  . Meningioma of optic nerve sheath     loss of vision right eye. s/p XRT, f/u at [facility]    . Nasal polyp   . GERD (gastroesophageal reflux disease)   . Erectile dysfunction   . Hypertension 09/23/2013    Past Surgical History  Procedure Laterality Date  . Cyst excision,wrist      History   Social History  . Marital Status: Married    Spouse Name: N/A    Number of Children: 2  . Years of Education: 16   Occupational History  . ACCOUNTANT    Social History Main Topics  . Smoking status: Current Every Day Smoker -- 1.00 packs/day for 33 years    Types: Cigarettes  . Smokeless tobacco: Never Used     Comment:  < 1 ppd   . Alcohol Use: Yes  . Drug Use: No  . Sexual Activity: Yes    Partners: Female   Other Topics Concern  . Not on file   Social History Narrative   HSG, [place] - [employer]. Married '82. 1 daughter, 1 son. 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 Marriage in good health        Medication List       This list is accurate as of: 11/29/13 11:59 PM.  Always use your most recent med list.               amLODipine 10 MG tablet  Commonly known as:  NORVASC  Take 1 tablet (10 mg total) by mouth daily.     esomeprazole 40 MG capsule  Commonly known as:  NEXIUM  TAKE 1 CAPSULE EVERY MORNING (OVER DUE FOR YEARLY PHYSICAL. MUST SEE DOCTOR FOR FURTHER REFILLS OF MED)     fluticasone 50 MCG/ACT nasal spray  Commonly known as:   FLONASE  USE 2 SPRAYS NASALLY DAILY AS DIRECTED     losartan 100 MG tablet  Commonly known as:  COZAAR  Take 1 tablet (100 mg total) by mouth daily.     tadalafil 20 MG tablet  Commonly known as:  CIALIS  Take 1 tablet (20 mg total) by mouth every other day as needed for erectile dysfunction.           Objective:   Physical Exam BP 132/72  Pulse 96  Temp(Src) 98.6 F (37 C) (Oral)  Ht 5\' 11"  (1.803 m)  Wt 202 lb 8 oz (91.853 kg)  BMI 28.26 kg/m2  SpO2 97% General -- alert, well-developed, NAD.   Lungs -- normal respiratory effort, no intercostal retractions, no accessory muscle use, and normal breath sounds.  Heart-- normal rate, regular rhythm, no murmur.   Extremities-- no pretibial edema bilaterally  Neurologic--  alert & oriented X3. Speech normal, gait appropriate for age, strength symmetric and appropriate for age.  Psych-- Cognition and judgment appear intact. Cooperative with normal attention span and concentration. No anxious or depressed appearing.  Assessment & Plan:

## 2013-12-28 ENCOUNTER — Other Ambulatory Visit: Payer: Self-pay | Admitting: Dermatology

## 2014-04-24 ENCOUNTER — Other Ambulatory Visit: Payer: Self-pay | Admitting: Internal Medicine

## 2014-05-03 ENCOUNTER — Encounter: Payer: BC Managed Care – PPO | Admitting: Internal Medicine

## 2014-10-18 ENCOUNTER — Other Ambulatory Visit: Payer: Self-pay | Admitting: Internal Medicine

## 2014-11-14 ENCOUNTER — Other Ambulatory Visit: Payer: Self-pay | Admitting: Internal Medicine

## 2014-11-15 ENCOUNTER — Other Ambulatory Visit: Payer: Self-pay

## 2015-02-21 ENCOUNTER — Telehealth: Payer: Self-pay | Admitting: Internal Medicine

## 2015-02-21 ENCOUNTER — Other Ambulatory Visit: Payer: Self-pay | Admitting: Internal Medicine

## 2015-02-21 NOTE — Telephone Encounter (Signed)
error:[id number] ° °

## 2015-02-22 ENCOUNTER — Encounter: Payer: Self-pay | Admitting: Internal Medicine

## 2015-02-22 ENCOUNTER — Ambulatory Visit (INDEPENDENT_AMBULATORY_CARE_PROVIDER_SITE_OTHER): Payer: BLUE CROSS/BLUE SHIELD | Admitting: Internal Medicine

## 2015-02-22 VITALS — BP 124/66 | HR 86 | Temp 98.0°F | Ht 71.0 in | Wt 210.2 lb

## 2015-02-22 DIAGNOSIS — F17200 Nicotine dependence, unspecified, uncomplicated: Secondary | ICD-10-CM

## 2015-02-22 DIAGNOSIS — K219 Gastro-esophageal reflux disease without esophagitis: Secondary | ICD-10-CM | POA: Diagnosis not present

## 2015-02-22 DIAGNOSIS — Z1159 Encounter for screening for other viral diseases: Secondary | ICD-10-CM

## 2015-02-22 DIAGNOSIS — N529 Male erectile dysfunction, unspecified: Secondary | ICD-10-CM

## 2015-02-22 DIAGNOSIS — I10 Essential (primary) hypertension: Secondary | ICD-10-CM | POA: Diagnosis not present

## 2015-02-22 DIAGNOSIS — Z1211 Encounter for screening for malignant neoplasm of colon: Secondary | ICD-10-CM

## 2015-02-22 DIAGNOSIS — Z09 Encounter for follow-up examination after completed treatment for conditions other than malignant neoplasm: Secondary | ICD-10-CM

## 2015-02-22 DIAGNOSIS — Z114 Encounter for screening for human immunodeficiency virus [HIV]: Secondary | ICD-10-CM

## 2015-02-22 LAB — CBC WITH DIFFERENTIAL/PLATELET
BASOS ABS: 0 10*3/uL (ref 0.0–0.1)
BASOS PCT: 0.2 % (ref 0.0–3.0)
EOS ABS: 0.4 10*3/uL (ref 0.0–0.7)
Eosinophils Relative: 7.2 % — ABNORMAL HIGH (ref 0.0–5.0)
HEMATOCRIT: 41.3 % (ref 39.0–52.0)
HEMOGLOBIN: 13.8 g/dL (ref 13.0–17.0)
LYMPHS PCT: 21.1 % (ref 12.0–46.0)
Lymphs Abs: 1.3 10*3/uL (ref 0.7–4.0)
MCHC: 33.5 g/dL (ref 30.0–36.0)
MCV: 94.4 fl (ref 78.0–100.0)
MONOS PCT: 7.3 % (ref 3.0–12.0)
Monocytes Absolute: 0.4 10*3/uL (ref 0.1–1.0)
Neutro Abs: 3.8 10*3/uL (ref 1.4–7.7)
Neutrophils Relative %: 64.2 % (ref 43.0–77.0)
Platelets: 337 10*3/uL (ref 150.0–400.0)
RBC: 4.37 Mil/uL (ref 4.22–5.81)
RDW: 13.2 % (ref 11.5–15.5)
WBC: 5.9 10*3/uL (ref 4.0–10.5)

## 2015-02-22 LAB — BASIC METABOLIC PANEL
BUN: 7 mg/dL (ref 6–23)
CALCIUM: 9.5 mg/dL (ref 8.4–10.5)
CHLORIDE: 102 meq/L (ref 96–112)
CO2: 27 meq/L (ref 19–32)
Creatinine, Ser: 0.86 mg/dL (ref 0.40–1.50)
GFR: 97.67 mL/min (ref >60.00)
GLUCOSE: 114 mg/dL — AB (ref 70–99)
Potassium: 4.6 mEq/L (ref 3.5–5.1)
SODIUM: 137 meq/L (ref 135–145)

## 2015-02-22 MED ORDER — LOSARTAN POTASSIUM 100 MG PO TABS: 100.0000 mg | ORAL_TABLET | Freq: Every day | ORAL | Status: DC

## 2015-02-22 MED ORDER — ESOMEPRAZOLE MAGNESIUM 40 MG PO CPDR: 40.0000 mg | DELAYED_RELEASE_CAPSULE | Freq: Every day | ORAL | Status: DC

## 2015-02-22 MED ORDER — AMLODIPINE BESYLATE 10 MG PO TABS: 10.0000 mg | ORAL_TABLET | Freq: Every day | ORAL | Status: DC

## 2015-02-22 MED ORDER — TADALAFIL 20 MG PO TABS: 20.0000 mg | ORAL_TABLET | ORAL | Status: DC | PRN

## 2015-02-22 MED ORDER — FLUTICASONE PROPIONATE 50 MCG/ACT NA SUSP: 2.0000 | Freq: Every day | NASAL | Status: DC

## 2015-02-22 NOTE — Progress Notes (Signed)
Pre visit review using our clinic review tool, if applicable. No additional management support is needed unless otherwise documented below in the visit note. 

## 2015-02-22 NOTE — Patient Instructions (Addendum)
Get your blood work before you leave    Check the  blood pressure   weekly  Be sure your blood pressure is between 110/65 and  145/85.  if it is consistently higher or lower, let me know   Next visit  for a    complete physical exam, fasting in 4 months Please schedule an appointment at the front desk  Think about Chantix or Wellbutrin     Steps to Quit Smoking  Smoking tobacco can be harmful to your health and can affect almost every organ in your body. Smoking puts you, and those around you, at risk for developing many serious chronic diseases. Quitting smoking is difficult, but it is one of the best things that you can do for your health. It is never too late to quit. WHAT ARE THE BENEFITS OF QUITTING SMOKING? When you quit smoking, you lower your risk of developing serious diseases and conditions, such as:  Lung cancer or lung disease, such as COPD.  Heart disease.  Stroke.  Heart attack.  Infertility.  Osteoporosis and bone fractures. Additionally, symptoms such as coughing, wheezing, and shortness of breath may get better when you quit. You may also find that you get sick less often because your body is stronger at fighting off colds and infections. If you are pregnant, quitting smoking can help to reduce your chances of having a baby of low birth weight. HOW DO I GET READY TO QUIT? When you decide to quit smoking, create a plan to make sure that you are successful. Before you quit:  Pick a date to quit. Set a date within the next two weeks to give you time to prepare.  Write down the reasons why you are quitting. Keep this list in places where you will see it often, such as on your bathroom mirror or in your car or wallet.  Identify the people, places, things, and activities that make you want to smoke (triggers) and avoid them. Make sure to take these actions:  Throw away all cigarettes at home, at work, and in your car.  Throw away smoking accessories, such as  Scientist, research (medical).  Clean your car and make sure to empty the ashtray.  Clean your home, including curtains and carpets.  Tell your family, friends, and coworkers that you are quitting. Support from your loved ones can make quitting easier.  Talk with your health care provider about your options for quitting smoking.  Find out what treatment options are covered by your health insurance. WHAT STRATEGIES CAN I USE TO QUIT SMOKING?  Talk with your healthcare provider about different strategies to quit smoking. Some strategies include:  Quitting smoking altogether instead of gradually lessening how much you smoke over a period of time. Research shows that quitting "cold [place]" is more successful than gradually quitting.  Attending in-person counseling to help you build problem-solving skills. You are more likely to have success in quitting if you attend several counseling sessions. Even short sessions of 10 minutes can be effective.  Finding resources and support systems that can help you to quit smoking and remain smoke-free after you quit. These resources are most helpful when you use them often. They can include:  Online chats with a Social worker.  Telephone quitlines.  Printed Furniture conservator/restorer.  Support groups or group counseling.  Text messaging programs.  Mobile phone applications.  Taking medicines to help you quit smoking. (If you are pregnant or breastfeeding, talk with your health care provider first.) Some  medicines contain nicotine and some do not. Both types of medicines help with cravings, but the medicines that include nicotine help to relieve withdrawal symptoms. Your health care provider may recommend:  Nicotine patches, gum, or lozenges.  Nicotine inhalers or sprays.  Non-nicotine medicine that is taken by mouth. Talk with your health care provider about combining strategies, such as taking medicines while you are also receiving in-person counseling. Using  these two strategies together makes you more likely to succeed in quitting than if you used either strategy on its own. If you are pregnant or breastfeeding, talk with your health care provider about finding counseling or other support strategies to quit smoking. Do not take medicine to help you quit smoking unless told to do so by your health care provider. WHAT THINGS CAN I DO TO MAKE IT EASIER TO QUIT? Quitting smoking might feel overwhelming at first, but there is a lot that you can do to make it easier. Take these important actions:  Reach out to your family and friends and ask that they support and encourage you during this time. Call telephone quitlines, reach out to support groups, or work with a counselor for support.  Ask people who smoke to avoid smoking around you.  Avoid places that trigger you to smoke, such as bars, parties, or smoke-break areas at work.  Spend time around people who do not smoke.  Lessen stress in your life, because stress can be a smoking trigger for some people. To lessen stress, try:  Exercising regularly.  Deep-breathing exercises.  Yoga.  Meditating.  Performing a body scan. This involves closing your eyes, scanning your body from head to toe, and noticing which parts of your body are particularly tense. Purposefully relax the muscles in those areas.  Download or purchase mobile phone or tablet apps (applications) that can help you stick to your quit plan by providing reminders, tips, and encouragement. There are many free apps, such as QuitGuide from the [employer] Office manager for Disease Control and Prevention). You can find other support for quitting smoking (smoking cessation) through smokefree.gov and other websites. HOW WILL I FEEL WHEN I QUIT SMOKING? Within the first 24 hours of quitting smoking, you may start to feel some withdrawal symptoms. These symptoms are usually most noticeable 2-3 days after quitting, but they usually do not last beyond 2-3  weeks. Changes or symptoms that you might experience include:  Mood swings.  Restlessness, anxiety, or irritation.  Difficulty concentrating.  Dizziness.  Strong cravings for sugary foods in addition to nicotine.  Mild weight gain.  Constipation.  Nausea.  Coughing or a sore throat.  Changes in how your medicines work in your body.  A depressed mood.  Difficulty sleeping (insomnia). After the first 2-3 weeks of quitting, you may start to notice more positive results, such as:  Improved sense of smell and taste.  Decreased coughing and sore throat.  Slower heart rate.  Lower blood pressure.  Clearer skin.  The ability to breathe more easily.  Fewer sick days. Quitting smoking is very challenging for most people. Do not get discouraged if you are not successful the first time. Some people need to make many attempts to quit before they achieve long-term success. Do your best to stick to your quit plan, and talk with your health care provider if you have any questions or concerns.   This information is not intended to replace advice given to you by your health care provider. Make sure you discuss any questions  you have with your health care provider.   Document Released: 02/19/2001 Document Revised: 07/12/2014 Document Reviewed: 07/12/2014 Elsevier Interactive Patient Education 2016 Elsevier Inc.  

## 2015-02-22 NOTE — Progress Notes (Signed)
Subjective:    Patient ID: Colin Watson, male    DOB: 1960/05/05, 54 y.o.   MRN: [id number]  DOS:  02/22/2015 Type of visit - description : Routine checkup  Interval history:  HTN: good compliance with medication, he checks his BPs rarely but usually normal. Needs a refill ED: Needs a refill Cialis GERD: Diagnosis in the past after he had some trouble swallowing chicken, symptoms are long gone,  has reduce Nexium to 3 or 4 times a week and he is still asymptomatic, stop Nexium? Tobacco: Planning to quit in the next few months.    Review of Systems   planning to eat healthier and start to exercise more as soon as the weather permits. No chest pain or difficulty breathing No nausea, vomiting, diarrhea  Past Medical History  Diagnosis Date  . 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 Nasal polyp   . GERD (gastroesophageal reflux disease)   . Erectile dysfunction   . Hypertension 09/23/2013    Past Surgical History  Procedure Laterality Date  . Cyst excision,wrist      Social History   Social History  . Marital Status: Married    Spouse Name: N/A  . Number of Children: 2  . Years of Education: 16   Occupational History  . ACCOUNTANT    Social History Main Topics  . Smoking status: Current Every Day Smoker -- 1.00 packs/day for 33 years    Types: Cigarettes  . Smokeless tobacco: Never Used     Comment:  < 1 ppd   . Alcohol Use: Yes  . Drug Use: No  . Sexual Activity:    Partners: Female   Other Topics Concern  . Not on file   Social History Narrative   HSG, [place] - [employer]. Married '82. 1 daughter, 1 son. 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 Marriage in good health        Medication List       This list is accurate as of: 02/22/15 12:53 PM.  Always use your most recent med list.               amLODipine 10 MG tablet  Commonly known as:  NORVASC  Take 1 tablet  (10 mg total) by mouth daily.     esomeprazole 40 MG capsule  Commonly known as:  NEXIUM  Take 1 capsule (40 mg total) by mouth daily before breakfast.     fluticasone 50 MCG/ACT nasal spray  Commonly known as:  FLONASE  Place 2 sprays into both nostrils daily.     losartan 100 MG tablet  Commonly known as:  COZAAR  Take 1 tablet (100 mg total) by mouth daily.     tadalafil 20 MG tablet  Commonly known as:  CIALIS  Take 1 tablet (20 mg total) by mouth every other day as needed for erectile dysfunction.           Objective:   Physical Exam BP 124/66 mmHg  Pulse 86  Temp(Src) 98 F (36.7 C) (Oral)  Ht 5\' 11"  (1.803 m)  Wt 210 lb 4 oz (95.369 kg)  BMI 29.34 kg/m2  SpO2 98% General:   Well developed, well nourished . NAD.  HEENT:  Normocephalic . Face symmetric, atraumatic. Neck: No thyromegaly or LAD Lungs:  CTA B Normal respiratory effort, no intercostal retractions, no accessory muscle use. Heart:  RRR,  no murmur.  no pretibial edema bilaterally  Abdomen:  Not distended, soft, non-tender. No rebound or rigidity.   Skin: Not pale. Not jaundice Neurologic:  alert & oriented X3.  Speech normal, gait appropriate for age and unassisted Psych--  Cognition and judgment appear intact.  Cooperative with normal attention span and concentration.  Behavior appropriate. No anxious or depressed appearing.    Assessment & Plan:   Assessment> HTN GERD ED Nasal polyp H/o meningioma, R optic never sheet, XRT, [facility]  PLAN HTN: Refill medicines, seems well-controlled, plans to improve his lifestyle and see if he can DC or reduce some of his meds. I agree w/ his plans, rec to monitor BPs, low BP symptoms discussed, check labs GERD: Nexium for a while, initial symptom was mild discomfort when he swallow, symptoms are long gone, patient likes to reduce PPIs, I agree. Call if symptoms resurface or take Nexium as needed Tobacco: We discussed quitting, medication options. He  does see his dentist regularly. ED: Refill Cialis as needed Primary care: Referred for a colonoscopy, check a hep C and HIV RTC 4-5 months for a physical

## 2015-02-22 NOTE — Assessment & Plan Note (Signed)
HTN: Refill medicines, seems well-controlled, plans to improve his lifestyle and see if he can DC or reduce some of his meds. I agree w/ his plans, rec to monitor BPs, low BP symptoms discussed, check labs GERD: Nexium for a while, initial symptom was mild discomfort when he swallow, symptoms are long gone, patient likes to reduce PPIs, I agree. Call if symptoms resurface or take Nexium as needed Tobacco: We discussed quitting, medication options. He does see his dentist regularly. ED: Refill Cialis as needed Primary care: Referred for a colonoscopy, check a hep C and HIV RTC 4-5 months for a physical

## 2015-02-23 LAB — HEPATITIS C ANTIBODY: HCV Ab: NEGATIVE

## 2015-02-23 LAB — HIV ANTIBODY (ROUTINE TESTING W REFLEX): HIV: NONREACTIVE

## 2015-07-21 ENCOUNTER — Telehealth: Payer: Self-pay | Admitting: Behavioral Health

## 2015-07-21 NOTE — Telephone Encounter (Signed)
Patient returning your call.

## 2015-07-21 NOTE — Telephone Encounter (Signed)
Unable to reach patient at time of Pre-Visit Call.  Left message for patient to return call when available.    

## 2015-07-21 NOTE — Telephone Encounter (Signed)
Patient rescheduled appointment for a later date.

## 2015-07-24 ENCOUNTER — Encounter: Payer: BLUE CROSS/BLUE SHIELD | Admitting: Internal Medicine

## 2015-07-31 ENCOUNTER — Encounter: Payer: BLUE CROSS/BLUE SHIELD | Admitting: Internal Medicine

## 2015-10-30 ENCOUNTER — Encounter: Payer: Self-pay | Admitting: Internal Medicine

## 2015-10-30 ENCOUNTER — Ambulatory Visit (INDEPENDENT_AMBULATORY_CARE_PROVIDER_SITE_OTHER): Payer: BLUE CROSS/BLUE SHIELD | Admitting: Internal Medicine

## 2015-10-30 VITALS — BP 130/76 | HR 91 | Temp 97.9°F | Resp 12 | Ht 71.0 in | Wt 213.2 lb

## 2015-10-30 DIAGNOSIS — K219 Gastro-esophageal reflux disease without esophagitis: Secondary | ICD-10-CM

## 2015-10-30 DIAGNOSIS — I10 Essential (primary) hypertension: Secondary | ICD-10-CM

## 2015-10-30 DIAGNOSIS — Z0000 Encounter for general adult medical examination without abnormal findings: Secondary | ICD-10-CM

## 2015-10-30 DIAGNOSIS — R739 Hyperglycemia, unspecified: Secondary | ICD-10-CM

## 2015-10-30 DIAGNOSIS — Z1211 Encounter for screening for malignant neoplasm of colon: Secondary | ICD-10-CM

## 2015-10-30 DIAGNOSIS — Z720 Tobacco use: Secondary | ICD-10-CM | POA: Diagnosis not present

## 2015-10-30 MED ORDER — VARENICLINE TARTRATE 1 MG PO TABS: 1.0000 mg | ORAL_TABLET | Freq: Two times a day (BID) | ORAL | 1 refills | Status: DC

## 2015-10-30 MED ORDER — LOSARTAN POTASSIUM 100 MG PO TABS: 100.0000 mg | ORAL_TABLET | Freq: Every day | ORAL | 1 refills | Status: DC

## 2015-10-30 MED ORDER — AMLODIPINE BESYLATE 10 MG PO TABS: 10.0000 mg | ORAL_TABLET | Freq: Every day | ORAL | 1 refills | Status: DC

## 2015-10-30 MED ORDER — VARENICLINE TARTRATE 0.5 MG X 11 & 1 MG X 42 PO MISC: ORAL | 0 refills | Status: DC

## 2015-10-30 NOTE — Progress Notes (Signed)
Subjective:    Patient ID: Colin Watson, male    DOB: [birth year], 55 y.o.   MRN: [id number]  DOS:  10/30/2015 Type of visit - description : Routine office visit Interval history: HTN: Good med compliance , ambulatory BPs are very good GERD: Stopped PPIs, on Zantac as needed, rarely needs it. Tobacco: Smokes a pack a day, ready to quit, Chantix?.   Review of Systems   Past Medical History:  Diagnosis Date  . Erectile dysfunction   . GERD (gastroesophageal reflux disease)   . Hypertension 09/23/2013  . Meningioma of optic nerve sheath    loss of vision right eye. s/p XRT, f/u at [facility]    . Nasal polyp     Past Surgical History:  Procedure Laterality Date  . cyst excision,wrist      Social History   Social History  . Marital status: Married    Spouse name: N/A  . Number of children: 2  . Years of education: 16   Occupational History  . ACCOUNTANT [employer]   Social History Main Topics  . Smoking status: Current Every Day Smoker    Packs/day: 1.00    Years: 33.00    Types: Cigarettes  . Smokeless tobacco: Never Used     Comment:  < 1 ppd   . Alcohol use Yes  . Drug use: No  . Sexual activity: Yes    Partners: Female   Other Topics Concern  . Not on file   Social History Narrative   HSG, [place] [employer]. Married '82. 1 daughter, 1 son. 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 Marriage in good health        Medication List       Accurate as of 10/30/15 11:59 PM. Always use your most recent med list.          amLODipine 10 MG tablet Commonly known as:  NORVASC Take 1 tablet (10 mg total) by mouth daily.   fluticasone 50 MCG/ACT nasal spray Commonly known as:  FLONASE Place 2 sprays into both nostrils daily.   losartan 100 MG tablet Commonly known as:  COZAAR Take 1 tablet (100 mg total) by mouth daily.   tadalafil 20 MG tablet Commonly known as:  CIALIS Take 1 tablet (20 mg total) by mouth  every other day as needed for erectile dysfunction.   varenicline 0.5 MG X 11 & 1 MG X 42 tablet Commonly known as:  CHANTIX STARTING MONTH PAK Take one 0.5 mg tablet by mouth once daily for 3 days, then increase to one 0.5 mg tablet twice daily for 4 days, then increase to one 1 mg tablet twice daily.   varenicline 1 MG tablet Commonly known as:  CHANTIX Take 1 tablet (1 mg total) by mouth 2 (two) times daily.   ZANTAC 75 75 MG tablet Generic drug:  ranitidine Take 75 mg by mouth 2 (two) times daily as needed for heartburn.          Objective:   Physical Exam BP 130/76 (BP Location: Left Arm, Patient Position: Sitting, Cuff Size: Normal)   Pulse 91   Temp 97.9 F (36.6 C) (Oral)   Resp 12   Ht 5\' 11"  (1.803 m)   Wt 213 lb 4 oz (96.7 kg)   SpO2 94%   BMI 29.74 kg/m  General:   Well developed, well nourished . NAD.  HEENT:  Normocephalic . Face symmetric, atraumatic Lungs:  CTA B Normal  respiratory effort, no intercostal retractions, no accessory muscle use. Heart: RRR,  no murmur.  No pretibial edema bilaterally  Skin: Not pale. Not jaundice Neurologic:  alert & oriented X3.  Speech normal, gait appropriate for age and unassisted Psych--  Cognition and judgment appear intact.  Cooperative with normal attention span and concentration.  Behavior appropriate. No anxious or depressed appearing.      Assessment & Plan:     Assessment> HTN GERD ED Nasal polyp H/o meningioma, R optic never sheet, XRT, [facility]  PLAN HTN: Continue losartan, amlodipine, BP today is very good, checking labs, RF meds  GERD: On Zantac as needed, essentially asx Tobacco abuse: Smokes a pack a day, would like to try Chantix, previously only tried nicotine supplements. How to take Chantix, side effects and advise regards tobacco cessation provided. See prescriptions. In preparation for his CPX will check a CMP, FLP, CBC, A1c, TSH, PSA. We refer to GI. RTC 4 months CPX  Today, I  spent more than 20   min with the patient: >50% of the time counseling regards Tobacco cessation and Chantix use.

## 2015-10-30 NOTE — Progress Notes (Signed)
Pre visit review using our clinic review tool, if applicable. No additional management support is needed unless otherwise documented below in the visit note. 

## 2015-10-30 NOTE — Patient Instructions (Addendum)
GO TO THE FRONT DESK Schedule your next appointment for a  Physical, no fasting in 4 months   Schedule labs to be done within 5 days , fasting   Start chantix   Steps to Quit Smoking  Smoking tobacco can be harmful to your health and can affect almost every organ in your body. Smoking puts you, and those around you, at risk for developing many serious chronic diseases. Quitting smoking is difficult, but it is one of the best things that you can do for your health. It is never too late to quit. WHAT ARE THE BENEFITS OF QUITTING SMOKING? When you quit smoking, you lower your risk of developing serious diseases and conditions, such as:  Lung cancer or lung disease, such as COPD.  Heart disease.  Stroke.  Heart attack.  Infertility.  Osteoporosis and bone fractures. Additionally, symptoms such as coughing, wheezing, and shortness of breath may get better when you quit. You may also find that you get sick less often because your body is stronger at fighting off colds and infections. If you are pregnant, quitting smoking can help to reduce your chances of having a baby of low birth weight. HOW DO I GET READY TO QUIT? When you decide to quit smoking, create a plan to make sure that you are successful. Before you quit:  Pick a date to quit. Set a date within the next two weeks to give you time to prepare.  Write down the reasons why you are quitting. Keep this list in places where you will see it often, such as on your bathroom mirror or in your car or wallet.  Identify the people, places, things, and activities that make you want to smoke (triggers) and avoid them. Make sure to take these actions:  Throw away all cigarettes at home, at work, and in your car.  Throw away smoking accessories, such as Scientist, research (medical).  Clean your car and make sure to empty the ashtray.  Clean your home, including curtains and carpets.  Tell your family, friends, and coworkers that you are  quitting. Support from your loved ones can make quitting easier.  Talk with your health care provider about your options for quitting smoking.  Find out what treatment options are covered by your health insurance. WHAT STRATEGIES CAN I USE TO QUIT SMOKING?  Talk with your healthcare provider about different strategies to quit smoking. Some strategies include:  Quitting smoking altogether instead of gradually lessening how much you smoke over a period of time. Research shows that quitting "cold [place]" is more successful than gradually quitting.  Attending in-person counseling to help you build problem-solving skills. You are more likely to have success in quitting if you attend several counseling sessions. Even short sessions of 10 minutes can be effective.  Finding resources and support systems that can help you to quit smoking and remain smoke-free after you quit. These resources are most helpful when you use them often. They can include:  Online chats with a Social worker.  Telephone quitlines.  Printed Furniture conservator/restorer.  Support groups or group counseling.  Text messaging programs.  Mobile phone applications.  Taking medicines to help you quit smoking. (If you are pregnant or breastfeeding, talk with your health care provider first.) Some medicines contain nicotine and some do not. Both types of medicines help with cravings, but the medicines that include nicotine help to relieve withdrawal symptoms. Your health care provider may recommend:  Nicotine patches, gum, or lozenges.  Nicotine inhalers  or sprays.  Non-nicotine medicine that is taken by mouth. Talk with your health care provider about combining strategies, such as taking medicines while you are also receiving in-person counseling. Using these two strategies together makes you more likely to succeed in quitting than if you used either strategy on its own. If you are pregnant or breastfeeding, talk with your health care  provider about finding counseling or other support strategies to quit smoking. Do not take medicine to help you quit smoking unless told to do so by your health care provider. WHAT THINGS CAN I DO TO MAKE IT EASIER TO QUIT? Quitting smoking might feel overwhelming at first, but there is a lot that you can do to make it easier. Take these important actions:  Reach out to your family and friends and ask that they support and encourage you during this time. Call telephone quitlines, reach out to support groups, or work with a counselor for support.  Ask people who smoke to avoid smoking around you.  Avoid places that trigger you to smoke, such as bars, parties, or smoke-break areas at work.  Spend time around people who do not smoke.  Lessen stress in your life, because stress can be a smoking trigger for some people. To lessen stress, try:  Exercising regularly.  Deep-breathing exercises.  Yoga.  Meditating.  Performing a body scan. This involves closing your eyes, scanning your body from head to toe, and noticing which parts of your body are particularly tense. Purposefully relax the muscles in those areas.  Download or purchase mobile phone or tablet apps (applications) that can help you stick to your quit plan by providing reminders, tips, and encouragement. There are many free apps, such as QuitGuide from the [employer] Office manager for Disease Control and Prevention). You can find other support for quitting smoking (smoking cessation) through smokefree.gov and other websites. HOW WILL I FEEL WHEN I QUIT SMOKING? Within the first 24 hours of quitting smoking, you may start to feel some withdrawal symptoms. These symptoms are usually most noticeable 2-3 days after quitting, but they usually do not last beyond 2-3 weeks. Changes or symptoms that you might experience include:  Mood swings.  Restlessness, anxiety, or irritation.  Difficulty concentrating.  Dizziness.  Strong cravings for  sugary foods in addition to nicotine.  Mild weight gain.  Constipation.  Nausea.  Coughing or a sore throat.  Changes in how your medicines work in your body.  A depressed mood.  Difficulty sleeping (insomnia). After the first 2-3 weeks of quitting, you may start to notice more positive results, such as:  Improved sense of smell and taste.  Decreased coughing and sore throat.  Slower heart rate.  Lower blood pressure.  Clearer skin.  The ability to breathe more easily.  Fewer sick days. Quitting smoking is very challenging for most people. Do not get discouraged if you are not successful the first time. Some people need to make many attempts to quit before they achieve long-term success. Do your best to stick to your quit plan, and talk with your health care provider if you have any questions or concerns.   This information is not intended to replace advice given to you by your health care provider. Make sure you discuss any questions you have with your health care provider.   Document Released: 02/19/2001 Document Revised: 07/12/2014 Document Reviewed: 07/12/2014 Elsevier Interactive Patient Education [employer].

## 2015-10-31 NOTE — Assessment & Plan Note (Signed)
HTN: Continue losartan, amlodipine, BP today is very good, checking labs, RF meds  GERD: On Zantac as needed, essentially asx Tobacco abuse: Smokes a pack a day, would like to try Chantix, previously only tried nicotine supplements. How to take Chantix, side effects and advise regards tobacco cessation provided. See prescriptions. In preparation for his CPX will check a CMP, FLP, CBC, A1c, TSH, PSA. We refer to GI. RTC 4 months CPX

## 2015-11-02 ENCOUNTER — Other Ambulatory Visit: Payer: BLUE CROSS/BLUE SHIELD

## 2015-12-18 ENCOUNTER — Ambulatory Visit (AMBULATORY_SURGERY_CENTER): Payer: Self-pay

## 2015-12-18 VITALS — Ht 71.0 in | Wt 213.0 lb

## 2015-12-18 DIAGNOSIS — Z860100 Personal history of colon polyps, unspecified: Secondary | ICD-10-CM

## 2015-12-18 MED ORDER — SUPREP BOWEL PREP KIT 17.5-3.13-1.6 GM/177ML PO SOLN: 1.0000 | Freq: Once | ORAL | 0 refills | Status: AC

## 2015-12-18 NOTE — Progress Notes (Signed)
No allergies to eggs or soy No past problems with anesthesia No diet meds No home oxygen  Declined emmi 

## 2015-12-20 ENCOUNTER — Other Ambulatory Visit (INDEPENDENT_AMBULATORY_CARE_PROVIDER_SITE_OTHER): Payer: BLUE CROSS/BLUE SHIELD

## 2015-12-20 DIAGNOSIS — R739 Hyperglycemia, unspecified: Secondary | ICD-10-CM

## 2015-12-20 DIAGNOSIS — Z0000 Encounter for general adult medical examination without abnormal findings: Secondary | ICD-10-CM | POA: Diagnosis not present

## 2015-12-20 LAB — BASIC METABOLIC PANEL
BUN: 8 mg/dL (ref 6–23)
CO2: 24 mEq/L (ref 19–32)
CREATININE: 0.77 mg/dL (ref 0.40–1.50)
Calcium: 9.1 mg/dL (ref 8.4–10.5)
Chloride: 102 mEq/L (ref 96–112)
GFR: 110.63 mL/min (ref >60.00)
GLUCOSE: 92 mg/dL (ref 70–99)
Potassium: 4.7 mEq/L (ref 3.5–5.1)
Sodium: 136 mEq/L (ref 135–145)

## 2015-12-20 LAB — CBC WITH DIFFERENTIAL/PLATELET
BASOS PCT: 0.5 % (ref 0.0–3.0)
Basophils Absolute: 0 10*3/uL (ref 0.0–0.1)
EOS ABS: 0.6 10*3/uL (ref 0.0–0.7)
EOS PCT: 8.1 % — AB (ref 0.0–5.0)
HEMATOCRIT: 39.9 % (ref 39.0–52.0)
HEMOGLOBIN: 13.6 g/dL (ref 13.0–17.0)
LYMPHS PCT: 26.5 % (ref 12.0–46.0)
Lymphs Abs: 1.9 10*3/uL (ref 0.7–4.0)
MCHC: 34 g/dL (ref 30.0–36.0)
MCV: 94.7 fl (ref 78.0–100.0)
MONO ABS: 0.5 10*3/uL (ref 0.1–1.0)
Monocytes Relative: 7.2 % (ref 3.0–12.0)
Neutro Abs: 4.1 10*3/uL (ref 1.4–7.7)
Neutrophils Relative %: 57.7 % (ref 43.0–77.0)
Platelets: 344 10*3/uL (ref 150.0–400.0)
RBC: 4.21 Mil/uL — AB (ref 4.22–5.81)
RDW: 13.2 % (ref 11.5–15.5)
WBC: 7.2 10*3/uL (ref 4.0–10.5)

## 2015-12-20 LAB — LIPID PANEL
CHOLESTEROL: 200 mg/dL (ref 0–200)
HDL: 73.4 mg/dL (ref >39.00)
LDL Cholesterol: 107 mg/dL — ABNORMAL HIGH (ref 0–99)
NONHDL: 126.28
Total CHOL/HDL Ratio: 3
Triglycerides: 97 mg/dL (ref 0.0–149.0)
VLDL: 19.4 mg/dL (ref 0.0–40.0)

## 2015-12-20 LAB — PSA: PSA: 0.38 ng/mL (ref 0.10–4.00)

## 2015-12-20 LAB — HEMOGLOBIN A1C: HEMOGLOBIN A1C: 5.2 % (ref 4.6–6.5)

## 2015-12-20 LAB — TSH: TSH: 1.69 u[IU]/mL (ref 0.35–4.50)

## 2015-12-21 ENCOUNTER — Encounter: Payer: Self-pay | Admitting: Internal Medicine

## 2016-01-01 ENCOUNTER — Encounter: Payer: BLUE CROSS/BLUE SHIELD | Admitting: Internal Medicine

## 2016-01-03 ENCOUNTER — Telehealth: Payer: Self-pay | Admitting: Internal Medicine

## 2016-01-04 ENCOUNTER — Encounter: Payer: BLUE CROSS/BLUE SHIELD | Admitting: Internal Medicine

## 2016-01-04 NOTE — Telephone Encounter (Signed)
No charge. 

## 2016-01-12 ENCOUNTER — Encounter: Payer: BLUE CROSS/BLUE SHIELD | Admitting: Internal Medicine

## 2016-01-14 ENCOUNTER — Other Ambulatory Visit: Payer: Self-pay | Admitting: Internal Medicine

## 2016-01-30 ENCOUNTER — Encounter: Payer: BLUE CROSS/BLUE SHIELD | Admitting: Internal Medicine

## 2016-02-08 ENCOUNTER — Encounter: Payer: Self-pay | Admitting: Internal Medicine

## 2016-04-12 ENCOUNTER — Ambulatory Visit: Payer: BLUE CROSS/BLUE SHIELD | Admitting: *Deleted

## 2016-04-12 VITALS — Ht 71.0 in | Wt 218.2 lb

## 2016-04-12 DIAGNOSIS — Z86010 Personal history of colonic polyps: Secondary | ICD-10-CM

## 2016-04-12 NOTE — Progress Notes (Signed)
Denies allergies to eggs or soy products. Denies complications with sedation or anesthesia. Denies O2 use. Denies use of diet or weight loss medications.  Emmi instructions given for colonoscopy.  Pt reports that he has suprep from last PV that is still in date and does not need new prescription sent.

## 2016-04-15 ENCOUNTER — Encounter: Payer: Self-pay | Admitting: Internal Medicine

## 2016-04-24 ENCOUNTER — Other Ambulatory Visit: Payer: Self-pay | Admitting: Internal Medicine

## 2016-04-26 ENCOUNTER — Ambulatory Visit (AMBULATORY_SURGERY_CENTER): Payer: BLUE CROSS/BLUE SHIELD | Admitting: Internal Medicine

## 2016-04-26 ENCOUNTER — Encounter: Payer: Self-pay | Admitting: Internal Medicine

## 2016-04-26 VITALS — BP 129/80 | HR 80 | Temp 98.7°F | Resp 17 | Ht 71.0 in | Wt 218.0 lb

## 2016-04-26 DIAGNOSIS — Z86010 Personal history of colonic polyps: Secondary | ICD-10-CM

## 2016-04-26 DIAGNOSIS — D128 Benign neoplasm of rectum: Secondary | ICD-10-CM

## 2016-04-26 DIAGNOSIS — K621 Rectal polyp: Secondary | ICD-10-CM

## 2016-04-26 DIAGNOSIS — D120 Benign neoplasm of cecum: Secondary | ICD-10-CM | POA: Diagnosis not present

## 2016-04-26 DIAGNOSIS — D129 Benign neoplasm of anus and anal canal: Secondary | ICD-10-CM

## 2016-04-26 MED ORDER — SODIUM CHLORIDE 0.9 % IV SOLN: 500.0000 mL | INTRAVENOUS | Status: DC

## 2016-04-26 NOTE — Progress Notes (Signed)
Report to PACU, RN, vss, BBS= Clear.  

## 2016-04-26 NOTE — Op Note (Signed)
[place] Patient Name: Colin Watson Procedure Date: 04/26/2016 11:32 AM MRN: [id number] Endoscopist: Docia Chuck. Henrene Pastor , MD Age: 56 Referring MD:  Date of Birth: 1960-10-25 Gender: Male Account #: [id number] Procedure:                Colonoscopy with cold snare polypectomy X2 Indications:              High risk colon cancer surveillance: Personal                            history of multiple (3 or more) adenomas. Index                            exam December 2011 with 3 adenomas. Overdue for                            follow-up Medicines:                Monitored Anesthesia Care Procedure:                Pre-Anesthesia Assessment:                           - Prior to the procedure, a History and Physical                            was performed, and patient medications and                            allergies were reviewed. The patient's tolerance of                            previous anesthesia was also reviewed. The risks                            and benefits of the procedure and the sedation                            options and risks were discussed with the patient.                            All questions were answered, and informed consent                            was obtained. Prior Anticoagulants: The patient has                            taken no previous anticoagulant or antiplatelet                            agents. ASA Grade Assessment: II - A patient with                            mild systemic disease. After reviewing the risks  and benefits, the patient was deemed in                            satisfactory condition to undergo the procedure.                           After obtaining informed consent, the colonoscope                            was passed under direct vision. Throughout the                            procedure, the patient's blood pressure, pulse, and                            oxygen saturations were  monitored continuously. The                            Model CF-HQ190L ([id number]) scope was introduced                            through the anus and advanced to the the cecum,                            identified by appendiceal orifice and ileocecal                            valve. The ileocecal valve, appendiceal orifice,                            and rectum were photographed. The quality of the                            bowel preparation was excellent. The colonoscopy                            was performed without difficulty. The patient                            tolerated the procedure well. The bowel preparation                            used was SUPREP. Scope In: 11:46:36 AM Scope Out: 12:02:56 PM Scope Withdrawal Time: 0 hours 14 minutes 50 seconds  Total Procedure Duration: 0 hours 16 minutes 20 seconds  Findings:                 Two polyps were found in the rectum and cecum. The                            polyps were 3 to 5 mm in size. These polyps were                            removed with a cold snare. Resection  and retrieval                            were complete.                           Multiple diverticula were found in the sigmoid                            colon.                           The exam was otherwise without abnormality on                            direct and retroflexion views. Complications:            No immediate complications. Estimated blood loss:                            None. Estimated Blood Loss:     Estimated blood loss: none. Impression:               - Two 3 to 5 mm polyps in the rectum and in the                            cecum, removed with a cold snare. Resected and                            retrieved.                           - Diverticulosis in the sigmoid colon.                           - The examination was otherwise normal on direct                            and retroflexion views. Recommendation:           -  Repeat colonoscopy in 5 years for surveillance.                           - Patient has a contact number available for                            emergencies. The signs and symptoms of potential                            delayed complications were discussed with the                            patient. Return to normal activities tomorrow.                            Written discharge instructions were provided to the  patient.                           - Resume previous diet.                           - Continue present medications.                           - Await pathology results. Docia Chuck. Henrene Pastor, MD 04/26/2016 12:25:57 PM This report has been signed electronically.

## 2016-04-26 NOTE — Progress Notes (Signed)
Pt's states no medical or surgical changes since previsit or office visit. 

## 2016-04-26 NOTE — Patient Instructions (Addendum)
YOU HAD AN ENDOSCOPIC PROCEDURE TODAY AT THE [facility]:   Refer to the procedure report that was given to you for any specific questions about what was found during the examination.  If the procedure report does not answer your questions, please call your gastroenterologist to clarify.  If you requested that your care partner not be given the details of your procedure findings, then the procedure report has been included in a sealed envelope for you to review at your convenience later.  YOU SHOULD EXPECT: Some feelings of bloating in the abdomen. Passage of more gas than usual.  Walking can help get rid of the air that was put into your GI tract during the procedure and reduce the bloating. If you had a lower endoscopy (such as a colonoscopy or flexible sigmoidoscopy) you may notice spotting of blood in your stool or on the toilet paper. If you underwent a bowel prep for your procedure, you may not have a normal bowel movement for a few days.  Please Note:  You might notice some irritation and congestion in your nose or some drainage.  This is from the oxygen used during your procedure.  There is no need for concern and it should clear up in a day or so.  SYMPTOMS TO REPORT IMMEDIATELY:   Following lower endoscopy (colonoscopy or flexible sigmoidoscopy):  Excessive amounts of blood in the stool  Significant tenderness or worsening of abdominal pains  Swelling of the abdomen that is new, acute  Fever of 100F or higher    For urgent or emergent issues, a gastroenterologist can be reached at any hour by calling [phone].   DIET:  We do recommend a small meal at first, but then you may proceed to your regular diet.  Drink plenty of fluids but you should avoid alcoholic beverages for 24 hours.  ACTIVITY:  You should plan to take it easy for the rest of today and you should NOT DRIVE or use heavy machinery until tomorrow (because of the sedation medicines used during the test).     FOLLOW UP: Our staff will call the number listed on your records the next business day following your procedure to check on you and address any questions or concerns that you may have regarding the information given to you following your procedure. If we do not reach you, we will leave a message.  However, if you are feeling well and you are not experiencing any problems, there is no need to return our call.  We will assume that you have returned to your regular daily activities without incident.  If any biopsies were taken you will be contacted by phone or by letter within the next 1-3 weeks.  Please call us at [phone] if you have not heard about the biopsies in 3 weeks.    SIGNATURES/CONFIDENTIALITY: You and/or your care partner have signed paperwork which will be entered into your electronic medical record.  These signatures attest to the fact that that the information above on your After Visit Summary has been reviewed and is understood.  Full responsibility of the confidentiality of this discharge information lies with you and/or your care-partner.   Resume medications. Information given on polyps,diverticulosis and high fiber diet. 

## 2016-04-26 NOTE — Progress Notes (Signed)
Called to room to assist during endoscopic procedure.  Patient ID and intended procedure confirmed with present staff. Received instructions for my participation in the procedure from the performing physician.  

## 2016-04-29 ENCOUNTER — Telehealth: Payer: Self-pay | Admitting: *Deleted

## 2016-04-29 ENCOUNTER — Telehealth: Payer: Self-pay

## 2016-04-29 NOTE — Telephone Encounter (Signed)
  Follow up Call-  Call back number 04/26/2016  Post procedure Call Back phone  # [phone]  Permission to leave phone message Yes  Some recent data might be hidden     Patient questions:  Do you have a fever, pain , or abdominal swelling? No. Pain Score  0 *  Have you tolerated food without any problems? Yes.    Have you been able to return to your normal activities? Yes.    Do you have any questions about your discharge instructions: Diet   No. Medications  No. Follow up visit  No.  Do you have questions or concerns about your Care? No.  Actions: * If pain score is 4 or above: No action needed, pain <4.

## 2016-04-29 NOTE — Telephone Encounter (Signed)
Attempted to reach pt. For follow up call following endoscopic procedure.   Will try again later today.

## 2016-05-01 ENCOUNTER — Encounter: Payer: Self-pay | Admitting: Internal Medicine

## 2016-08-27 ENCOUNTER — Other Ambulatory Visit: Payer: Self-pay | Admitting: Internal Medicine

## 2016-08-28 MED ORDER — LOSARTAN POTASSIUM 100 MG PO TABS: 100.0000 mg | ORAL_TABLET | Freq: Every day | ORAL | 0 refills | Status: DC

## 2016-08-28 MED ORDER — AMLODIPINE BESYLATE 10 MG PO TABS: 10.0000 mg | ORAL_TABLET | Freq: Every day | ORAL | 0 refills | Status: DC

## 2016-08-28 NOTE — Telephone Encounter (Signed)
Last OV: 10/30/2015, PCP recommended f/u 4 months for CPE. Pt has CPE scheduled for 09/24/2016 at 3PM. Will send 90 of amlodipine and losartan to [facility]. Pt informed via MyChart that he will need to keep appt in July for further refills hereafter.

## 2016-09-24 ENCOUNTER — Ambulatory Visit (INDEPENDENT_AMBULATORY_CARE_PROVIDER_SITE_OTHER): Payer: BLUE CROSS/BLUE SHIELD | Admitting: Internal Medicine

## 2016-09-24 ENCOUNTER — Encounter: Payer: Self-pay | Admitting: Internal Medicine

## 2016-09-24 VITALS — BP 128/72 | HR 92 | Temp 98.0°F | Resp 14 | Ht 71.0 in | Wt 219.5 lb

## 2016-09-24 DIAGNOSIS — I10 Essential (primary) hypertension: Secondary | ICD-10-CM

## 2016-09-24 DIAGNOSIS — K219 Gastro-esophageal reflux disease without esophagitis: Secondary | ICD-10-CM

## 2016-09-24 DIAGNOSIS — Z0000 Encounter for general adult medical examination without abnormal findings: Secondary | ICD-10-CM

## 2016-09-24 DIAGNOSIS — Z23 Encounter for immunization: Secondary | ICD-10-CM

## 2016-09-24 DIAGNOSIS — Z720 Tobacco use: Secondary | ICD-10-CM | POA: Diagnosis not present

## 2016-09-24 MED ORDER — VARENICLINE TARTRATE 1 MG PO TABS: 1.0000 mg | ORAL_TABLET | Freq: Two times a day (BID) | ORAL | 2 refills | Status: DC

## 2016-09-24 MED ORDER — VARENICLINE TARTRATE 0.5 MG X 11 & 1 MG X 42 PO MISC: ORAL | 0 refills | Status: DC

## 2016-09-24 NOTE — Assessment & Plan Note (Signed)
HTN: Seems well-controlled on losartan and amlodipine. Check labs GERD: Well controlled on Zantac as needed Tobacco abuse: Patient ready  to quit in the next few weeks, has a prescription for Chantix but never used it, request a new prescription. Counseling: tips on how to quit and new chantix Rx provided. RTC 1 year

## 2016-09-24 NOTE — Assessment & Plan Note (Addendum)
-  Td  today -Prostate cancer screening: No family history, PSA 2017 normal. Reassess next year -CCS: Cscope 04-2016, polyps, 5 years -Heavy smoker, lung cancer screening discuss as an option, patient will find if covered by  insurance because he is interested -Aspirin 81 for CV protection recommended  -labs from last year reviewed, check a CMP and FLP. -Diet and exercise discussed -EKG: Sinus rhythm, incomplete RBBB. Asx, no old EKGs.

## 2016-09-24 NOTE — Progress Notes (Signed)
Subjective:    Patient ID: Colin Watson, male    DOB: 1960-06-02, 56 y.o.   MRN: [id number]  DOS:  09/24/2016 Type of visit - description : CPX Interval history: Feeling well. Ready to quit tobacco.   Review of Systems  Other than above, a 14 point review of systems is negative   Past Medical History:  Diagnosis Date  . Erectile dysfunction   . GERD (gastroesophageal reflux disease)   . Hypertension 09/23/2013  . 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 Nasal polyp     Past Surgical History:  Procedure Laterality Date  . cyst excision,wrist      Social History   Social History  . Marital status: Married    Spouse name: N/A  . Number of children: 2  . Years of education: 16   Occupational History  . ACCOUNTANT [employer]   Social History Main Topics  . Smoking status: Current Every Day Smoker    Packs/day: 1.00    Years: 33.00    Types: Cigarettes  . Smokeless tobacco: Current User     Comment: <1 ppd   . Alcohol use 3.6 oz/week    6 Cans of beer per week  . Drug use: No  . Sexual activity: Yes    Partners: Female   Other Topics Concern  . Not on file   Social History Narrative   HSG, [employer] [place] [employer]. Married '82. 1 daughter, 1 son. 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 Marriage in good health     Family History  Problem Relation Age of Onset  . COPD Father   . Diabetes Neg Hx   . Heart disease Neg Hx   . Cancer Neg Hx   . Colon cancer Neg Hx      Allergies as of 09/24/2016      Reactions   Ampicillin       Medication List       Accurate as of 09/24/16  8:57 PM. Always use your most recent med list.          amLODipine 10 MG tablet Commonly known as:  NORVASC Take 1 tablet (10 mg total) by mouth daily.   aspirin EC 81 MG tablet Take 81 mg by mouth daily.   fluticasone 50 MCG/ACT nasal spray Commonly known as:  FLONASE Place 2  sprays into both nostrils daily.   losartan 100 MG tablet Commonly known as:  COZAAR Take 1 tablet (100 mg total) by mouth daily.   tadalafil 20 MG tablet Commonly known as:  CIALIS Take 1 tablet (20 mg total) by mouth every other day as needed for erectile dysfunction.   varenicline 1 MG tablet Commonly known as:  CHANTIX Take 1 tablet (1 mg total) by mouth 2 (two) times daily.   varenicline 0.5 MG X 11 & 1 MG X 42 tablet Commonly known as:  CHANTIX STARTING MONTH PAK Take one 0.5 mg tablet by mouth once daily for 3 days, then increase to one 0.5 mg tablet twice daily for 4 days, then increase to one 1 mg tablet twice daily.   ZANTAC 75 75 MG tablet Generic drug:  ranitidine Take 75 mg by mouth 2 (two) times daily as needed for heartburn.          Objective:   Physical Exam BP 128/72 (BP Location: Left Arm, Patient Position: Sitting,  Cuff Size: Normal)   Pulse 92   Temp 98 F (36.7 C) (Oral)   Resp 14   Ht 5\' 11"  (1.803 m)   Wt 219 lb 8 oz (99.6 kg)   SpO2 90%   BMI 30.61 kg/m   General:   Well developed, well nourished . NAD.  Neck: No  thyromegaly  HEENT:  Normocephalic . Face symmetric, atraumatic Lungs:  CTA B Normal respiratory effort, no intercostal retractions, no accessory muscle use. Heart: RRR,  no murmur.  No pretibial edema bilaterally  Abdomen:  Not distended, soft, non-tender. No rebound or rigidity.   Skin: Exposed areas without rash. Not pale. Not jaundice Neurologic:  alert & oriented X3.  Speech normal, gait appropriate for age and unassisted Strength symmetric and appropriate for age.  Psych: Cognition and judgment appear intact.  Cooperative with normal attention span and concentration.  Behavior appropriate. No anxious or depressed appearing.    Assessment & Plan:    Assessment> HTN GERD ED Nasal polyp H/o meningioma, R optic never sheet, XRT, f/u @ [facility]  PLAN HTN: Seems well-controlled on losartan and amlodipine. Check  labs GERD: Well controlled on Zantac as needed Tobacco abuse: Patient ready  to quit in the next few weeks, has a prescription for Chantix but never used it, request a new prescription. Counseling: tips on how to quit and new chantix Rx provided. RTC 1 year

## 2016-09-24 NOTE — Patient Instructions (Addendum)
GO TO THE LAB : Get the blood work     GO TO [facility] Come back for blood work this week, fasting.  Schedule your next appointment for a  physical exam in one year  Start taking aspirin 81 mg with breakfast  Take Chantix as prescribed: Starting package for the first few weeks then 1 tablet twice a day x 2 or 3 additional months

## 2016-09-24 NOTE — Progress Notes (Signed)
Pre visit review using our clinic review tool, if applicable. No additional management support is needed unless otherwise documented below in the visit note. 

## 2016-09-26 ENCOUNTER — Other Ambulatory Visit (INDEPENDENT_AMBULATORY_CARE_PROVIDER_SITE_OTHER): Payer: BLUE CROSS/BLUE SHIELD

## 2016-09-26 DIAGNOSIS — Z0000 Encounter for general adult medical examination without abnormal findings: Secondary | ICD-10-CM

## 2016-09-26 LAB — COMPREHENSIVE METABOLIC PANEL
ALBUMIN: 4.4 g/dL (ref 3.5–5.2)
ALK PHOS: 92 U/L (ref 39–117)
ALT: 18 U/L (ref 0–53)
AST: 14 U/L (ref 0–37)
BILIRUBIN TOTAL: 0.8 mg/dL (ref 0.2–1.2)
BUN: 8 mg/dL (ref 6–23)
CO2: 27 mEq/L (ref 19–32)
Calcium: 9.4 mg/dL (ref 8.4–10.5)
Chloride: 100 mEq/L (ref 96–112)
Creatinine, Ser: 0.77 mg/dL (ref 0.40–1.50)
GFR: 110.33 mL/min (ref >60.00)
GLUCOSE: 107 mg/dL — AB (ref 70–99)
Potassium: 4.3 mEq/L (ref 3.5–5.1)
SODIUM: 133 meq/L — AB (ref 135–145)
TOTAL PROTEIN: 6.8 g/dL (ref 6.0–8.3)

## 2016-09-26 LAB — LIPID PANEL
CHOLESTEROL: 208 mg/dL — AB (ref 0–200)
HDL: 84.8 mg/dL (ref >39.00)
LDL Cholesterol: 112 mg/dL — ABNORMAL HIGH (ref 0–99)
NONHDL: 123.38
Total CHOL/HDL Ratio: 2
Triglycerides: 58 mg/dL (ref 0.0–149.0)
VLDL: 11.6 mg/dL (ref 0.0–40.0)

## 2016-10-31 ENCOUNTER — Other Ambulatory Visit: Payer: Self-pay | Admitting: Internal Medicine

## 2016-11-01 MED ORDER — LOSARTAN POTASSIUM 100 MG PO TABS: 100.0000 mg | ORAL_TABLET | Freq: Every day | ORAL | 3 refills | Status: DC

## 2016-11-01 MED ORDER — AMLODIPINE BESYLATE 10 MG PO TABS: 10.0000 mg | ORAL_TABLET | Freq: Every day | ORAL | 3 refills | Status: DC

## 2016-11-21 ENCOUNTER — Telehealth: Payer: BLUE CROSS/BLUE SHIELD | Admitting: Physician Assistant

## 2016-11-21 DIAGNOSIS — B9789 Other viral agents as the cause of diseases classified elsewhere: Secondary | ICD-10-CM

## 2016-11-21 DIAGNOSIS — J329 Chronic sinusitis, unspecified: Secondary | ICD-10-CM

## 2016-11-21 MED ORDER — AZELASTINE HCL 0.1 % NA SOLN: 1.0000 | Freq: Two times a day (BID) | NASAL | 0 refills | Status: DC

## 2016-11-21 NOTE — Progress Notes (Signed)
We are sorry that you are not feeling well.  Here is how we plan to help!  Based on what you have shared with me it looks like you have sinusitis.  Sinusitis is inflammation and infection in the sinus cavities of the head.  Based on your presentation I believe you most likely have Acute Viral Sinusitis.This is an infection most likely caused by a virus. There is not specific treatment for viral sinusitis other than to help you with the symptoms until the infection runs its course.  You may use an oral decongestant such as Mucinex D or if you have glaucoma or high blood pressure use plain Mucinex. Saline nasal spray help and can safely be used as often as needed for congestion, I have prescribed: Azelastine nasal spray 2 sprays in each nostril twice a day. Use this with Flonase nasal spray to help with sinus congestion/pressure and to help with the ears.  Some authorities believe that zinc sprays or the use of Echinacea may shorten the course of your symptoms.  Sinus infections are not as easily transmitted as other respiratory infection, however we still recommend that you avoid close contact with loved ones, especially the very young and elderly.  Remember to wash your hands thoroughly throughout the day as this is the number one way to prevent the spread of infection!  Home Care:  Only take medications as instructed by your medical team.  Complete the entire course of an antibiotic.  Do not take these medications with alcohol.  A steam or ultrasonic humidifier can help congestion.  You can place a towel over your head and breathe in the steam from hot water coming from a faucet.  Avoid close contacts especially the very young and the elderly.  Cover your mouth when you cough or sneeze.  Always remember to wash your hands.  Get Help Right Away If:  You develop worsening fever or sinus pain.  You develop a severe head ache or visual changes.  Your symptoms persist after you have  completed your treatment plan.  Make sure you  Understand these instructions.  Will watch your condition.  Will get help right away if you are not doing well or get worse.  Your e-visit answers were reviewed by a board certified advanced clinical practitioner to complete your personal care plan.  Depending on the condition, your plan could have included both over the counter or prescription medications.  If there is a problem please reply  once you have received a response from your provider.  Your safety is important to [place].  If you have drug allergies check your prescription carefully.    You can use MyChart to ask questions about today's visit, request a non-urgent call back, or ask for a work or school excuse for 24 hours related to this e-Visit. If it has been greater than 24 hours you will need to follow up with your provider, or enter a new e-Visit to address those concerns.  You will get an e-mail in the next two days asking about your experience.  I hope that your e-visit has been valuable and will speed your recovery. Thank you for using e-visits.

## 2016-12-06 ENCOUNTER — Ambulatory Visit (INDEPENDENT_AMBULATORY_CARE_PROVIDER_SITE_OTHER): Payer: BLUE CROSS/BLUE SHIELD | Admitting: Medical

## 2016-12-06 ENCOUNTER — Encounter: Payer: Self-pay | Admitting: Medical

## 2016-12-06 VITALS — BP 148/77 | HR 76 | Temp 97.8°F | Ht 71.0 in | Wt 217.6 lb

## 2016-12-06 DIAGNOSIS — H938X3 Other specified disorders of ear, bilateral: Secondary | ICD-10-CM

## 2016-12-06 DIAGNOSIS — J3089 Other allergic rhinitis: Secondary | ICD-10-CM

## 2016-12-06 DIAGNOSIS — R0981 Nasal congestion: Secondary | ICD-10-CM | POA: Diagnosis not present

## 2016-12-06 MED ORDER — METHYLPREDNISOLONE ACETATE 40 MG/ML IJ SUSP
40.0000 mg | Freq: Once | INTRAMUSCULAR | Status: AC
  Administered 2016-12-06: 40 mg via INTRAMUSCULAR

## 2016-12-06 MED ORDER — AZITHROMYCIN 250 MG PO TABS: ORAL_TABLET | ORAL | 0 refills | Status: DC

## 2016-12-06 NOTE — Patient Instructions (Addendum)
You do appear to have had recent allergic rhinitis and nasal congestion persisting. You can continue Mucinex but recommend non d form. Also can continue Flonase and Astelin. I think you would benefit from Depo-Medrol 40 mg IM injection today.  If your symptoms worsen over weekend such as increased ear pain, sinus pressure or productive cough recommend that she go ahead and start azithromycin antibiotic. Printing the prescription today but I don't think you needed presently.  Follow-up in 7-10 days or as needed.

## 2016-12-06 NOTE — Progress Notes (Signed)
Subjective:    Patient ID: Colin Watson, male    DOB: 02-09-1961, 56 y.o.   MRN: [id number]  HPI  Pt in feeling nasal congestion x 2 weeks. Pt has been using mucinex, flonase and astelin.   Pt has been sneezing. Hx of allergies. Some ear pressure. He has mild relief since starting astelin. He is concerned for sinus infection.  No fever, no chills or sweats.  No chest congestion.      Review of Systems  Constitutional: Negative for chills, fatigue and fever.  HENT: Positive for congestion, ear pain and sneezing. Negative for nosebleeds, postnasal drip, sinus pain, sinus pressure and sore throat.   Respiratory: Negative for cough, chest tightness, shortness of breath and wheezing.   Cardiovascular: Negative for chest pain and palpitations.  Gastrointestinal: Negative for abdominal pain, blood in stool, constipation, diarrhea, rectal pain and vomiting.  Musculoskeletal: Negative for back pain, myalgias and neck stiffness.  Skin: Negative for rash.  Neurological: Negative for dizziness and headaches.  Hematological: Negative for adenopathy. Does not bruise/bleed easily.  Psychiatric/Behavioral: Negative for behavioral problems and confusion.   Past Medical History:  Diagnosis Date  . Erectile dysfunction   . GERD (gastroesophageal reflux disease)   . Hypertension 09/23/2013  . 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 Nasal polyp      Social History   Social History  . Marital status: Married    Spouse name: N/A  . Number of children: 2  . Years of education: 16   Occupational History  . ACCOUNTANT [employer]   Social History Main Topics  . Smoking status: Current Every Day Smoker    Packs/day: 1.00    Years: 33.00    Types: Cigarettes  . Smokeless tobacco: Current User     Comment: <1 ppd   . Alcohol use 3.6 oz/week    6 Cans of beer per week  . Drug use: No  . Sexual activity: Yes    Partners:  Female   Other Topics Concern  . Not on file   Social History Narrative   HSG, [place] - [employer]. Married '82. 1 daughter, 1 son. 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 Marriage in good health    Past Surgical History:  Procedure Laterality Date  . cyst excision,wrist      Family History  Problem Relation Age of Onset  . COPD Father   . Diabetes Neg Hx   . Heart disease Neg Hx   . Cancer Neg Hx   . Colon cancer Neg Hx     Allergies  Allergen Reactions  . Ampicillin     Current Outpatient Prescriptions on File Prior to Visit  Medication Sig Dispense Refill  . amLODipine (NORVASC) 10 MG tablet Take 1 tablet (10 mg total) by mouth daily. 90 tablet 3  . aspirin EC 81 MG tablet Take 81 mg by mouth daily.    Marland Kitchen azelastine (ASTELIN) 0.1 % nasal spray Place 1 spray into both nostrils 2 (two) times daily. Use in each nostril as directed 30 mL 0  . fluticasone (FLONASE) 50 MCG/ACT nasal spray Place 2 sprays into both nostrils daily. 48 g 3  . losartan (COZAAR) 100 MG tablet Take 1 tablet (100 mg total) by mouth daily. 90 tablet 3  . ranitidine (ZANTAC 75) 75 MG tablet Take 75 mg by mouth 2 (two) times daily as  needed for heartburn.    . tadalafil (CIALIS) 20 MG tablet Take 1 tablet (20 mg total) by mouth every other day as needed for erectile dysfunction. 10 tablet 2  . varenicline (CHANTIX STARTING MONTH PAK) 0.5 MG X 11 & 1 MG X 42 tablet Take one 0.5 mg tablet by mouth once daily for 3 days, then increase to one 0.5 mg tablet twice daily for 4 days, then increase to one 1 mg tablet twice daily. (Patient not taking: Reported on 12/06/2016) 53 tablet 0  . varenicline (CHANTIX) 1 MG tablet Take 1 tablet (1 mg total) by mouth 2 (two) times daily. (Patient not taking: Reported on 12/06/2016) 60 tablet 2   No current facility-administered medications on file prior to visit.     BP (!) 148/77 (BP Location: Right Arm, Patient Position: Sitting, Cuff Size:  Large)   Pulse 76   Temp 97.8 F (36.6 C) (Oral)   Ht 5\' 11"  (1.803 m)   Wt 217 lb 9.6 oz (98.7 kg)   SpO2 100%   BMI 30.35 kg/m        Objective:   Physical Exam  General  Mental Status - Alert. General Appearance - Well groomed. Not in acute distress.  Sounds moderately nasal congested.  Skin Rashes- No Rashes.  HEENT Head- Normal. Ear Auditory Canal - Left- Normal. Right - Normal.Tympanic Membrane- Left- Normal. Right- Normal. Eye Sclera/Conjunctiva- Left- Normal. Right- Normal. Nose & Sinuses Nasal Mucosa- Left-  Boggy and Congested. Right-  Boggy and  Congested.Bilateral no  maxillary and no frontal sinus pressure.(deviated septum) Mouth & Throat Lips: Upper Lip- Normal: no dryness, cracking, pallor, cyanosis, or vesicular eruption. Lower Lip-Normal: no dryness, cracking, pallor, cyanosis or vesicular eruption. Buccal Mucosa- Bilateral- No Aphthous ulcers. Oropharynx- No Discharge or Erythema. Tonsils: Characteristics- Bilateral- No Erythema or Congestion. Size/Enlargement- Bilateral- No enlargement. Discharge- bilateral-None.  Neck Neck- Supple. No Masses.   Chest and Lung Exam Auscultation: Breath Sounds:-Clear even and unlabored.  Cardiovascular Auscultation:Rythm- Regular, rate and rhythm. Murmurs & Other Heart Sounds:Ausculatation of the heart reveal- No Murmurs.  Lymphatic Head & Neck General Head & Neck Lymphatics: Bilateral: Description- No Localized lymphadenopathy.       Assessment & Plan:  You do appear to have had recent allergic rhinitis and nasal congestion persisting. You can continue Mucinex but recommend non d form. Also can continue Flonase and Astelin. I think he would benefit from Depo-Medrol 40 mg IM injection today.  If your symptoms worsen over weekend such as increased ear pain, sinus pressure or productive cough recommend that she go ahead and start azithromycin antibiotic. Printing the prescription today but I don't think you  needed presently.  Follow-up in 7-10 days or as needed.

## 2017-02-23 ENCOUNTER — Other Ambulatory Visit: Payer: Self-pay | Admitting: Internal Medicine

## 2017-09-26 ENCOUNTER — Encounter: Payer: Self-pay | Admitting: Internal Medicine

## 2017-09-26 ENCOUNTER — Telehealth: Payer: Self-pay | Admitting: *Deleted

## 2017-09-26 ENCOUNTER — Ambulatory Visit (INDEPENDENT_AMBULATORY_CARE_PROVIDER_SITE_OTHER): Payer: BLUE CROSS/BLUE SHIELD | Admitting: Internal Medicine

## 2017-09-26 VITALS — BP 126/84 | HR 86 | Temp 97.6°F | Resp 14 | Ht 71.0 in | Wt 227.0 lb

## 2017-09-26 DIAGNOSIS — Z0000 Encounter for general adult medical examination without abnormal findings: Secondary | ICD-10-CM

## 2017-09-26 DIAGNOSIS — Z125 Encounter for screening for malignant neoplasm of prostate: Secondary | ICD-10-CM | POA: Diagnosis not present

## 2017-09-26 DIAGNOSIS — Z122 Encounter for screening for malignant neoplasm of respiratory organs: Secondary | ICD-10-CM

## 2017-09-26 DIAGNOSIS — Z720 Tobacco use: Secondary | ICD-10-CM

## 2017-09-26 LAB — CBC WITH DIFFERENTIAL/PLATELET
BASOS ABS: 0.1 10*3/uL (ref 0.0–0.1)
Basophils Relative: 0.9 % (ref 0.0–3.0)
Eosinophils Absolute: 0.5 10*3/uL (ref 0.0–0.7)
Eosinophils Relative: 6.8 % — ABNORMAL HIGH (ref 0.0–5.0)
HCT: 42.6 % (ref 39.0–52.0)
Hemoglobin: 14.4 g/dL (ref 13.0–17.0)
LYMPHS ABS: 1.7 10*3/uL (ref 0.7–4.0)
LYMPHS PCT: 24.2 % (ref 12.0–46.0)
MCHC: 33.7 g/dL (ref 30.0–36.0)
MCV: 95.1 fl (ref 78.0–100.0)
MONOS PCT: 8.8 % (ref 3.0–12.0)
Monocytes Absolute: 0.6 10*3/uL (ref 0.1–1.0)
NEUTROS PCT: 59.3 % (ref 43.0–77.0)
Neutro Abs: 4.2 10*3/uL (ref 1.4–7.7)
PLATELETS: 357 10*3/uL (ref 150.0–400.0)
RBC: 4.48 Mil/uL (ref 4.22–5.81)
RDW: 12.8 % (ref 11.5–15.5)
WBC: 7.1 10*3/uL (ref 4.0–10.5)

## 2017-09-26 LAB — LIPID PANEL
CHOL/HDL RATIO: 3
Cholesterol: 218 mg/dL — ABNORMAL HIGH (ref 0–200)
HDL: 74.9 mg/dL (ref >39.00)
LDL Cholesterol: 129 mg/dL — ABNORMAL HIGH (ref 0–99)
NONHDL: 143.03
Triglycerides: 70 mg/dL (ref 0.0–149.0)
VLDL: 14 mg/dL (ref 0.0–40.0)

## 2017-09-26 LAB — COMPREHENSIVE METABOLIC PANEL
ALT: 20 U/L (ref 0–53)
AST: 14 U/L (ref 0–37)
Albumin: 4.3 g/dL (ref 3.5–5.2)
Alkaline Phosphatase: 90 U/L (ref 39–117)
BILIRUBIN TOTAL: 0.8 mg/dL (ref 0.2–1.2)
BUN: 8 mg/dL (ref 6–23)
CHLORIDE: 98 meq/L (ref 96–112)
CO2: 27 meq/L (ref 19–32)
Calcium: 9.4 mg/dL (ref 8.4–10.5)
Creatinine, Ser: 0.82 mg/dL (ref 0.40–1.50)
GFR: 102.25 mL/min (ref >60.00)
GLUCOSE: 109 mg/dL — AB (ref 70–99)
Potassium: 4.8 mEq/L (ref 3.5–5.1)
Sodium: 133 mEq/L — ABNORMAL LOW (ref 135–145)
Total Protein: 7 g/dL (ref 6.0–8.3)

## 2017-09-26 LAB — PSA: PSA: 0.73 ng/mL (ref 0.10–4.00)

## 2017-09-26 LAB — TSH: TSH: 1.21 u[IU]/mL (ref 0.35–4.50)

## 2017-09-26 NOTE — Patient Instructions (Signed)
GO TO THE LAB : Get the blood work     GO TO THE FRONT DESK Schedule your next appointment for a physical exam in 1 year    Check the  blood pressure   monthly  Be sure your blood pressure is between 110/65 and  135/85. If it is consistently higher or lower, let me know   Start Chantix when ready See your dentist yearly

## 2017-09-26 NOTE — Assessment & Plan Note (Addendum)
-  Td  09/2016 -Prostate cancer screening: No family history, DRE normal today, check a PSA -CCS: Cscope 04-2016, polyps, 5 years -Heavy smoker, lung cancer screening discussed, will proceed w/ a low dose CT. -Aspirin 81 for CV protection recommended  -labs: CMP, CBC, FLP, TSH, PSA -Diet and exercise discussed  -Tobacco: Still smoking, has not started Chantix just yet but is considering it.  Recommend to see the dentist yearly.

## 2017-09-26 NOTE — Telephone Encounter (Signed)
Received Dermatopathology Report results from [facility]; forwarded to provider/SLS 07/19

## 2017-09-26 NOTE — Progress Notes (Signed)
Subjective:    Patient ID: Colin Watson, male    DOB: December 13, 1960, 57 y.o.   MRN: [id number]  DOS:  09/26/2017 Type of visit - description : cpx Interval history: In general feeling well, he does have some concerns   Review of Systems Lower extremity edema around the ankles since October when he flew to [place]. Since then, symptoms are on and off, symmetric, mild, resolved by itself.  No chest pain no difficulty breathing Also, on and off pain both hips usually after he drives for few hours and stands up.  Pain goes away with walking. Play golf several days in May 2019, he thinks he injured his elbow, the area was a slightly swollen, is getting better. From time to time the right thumb base get extremely painful with certain movements.  Trigger phenomena?.  Other than above, a 14 point review of systems is negative    Past Medical History:  Diagnosis Date  . Erectile dysfunction   . GERD (gastroesophageal reflux disease)   . Hypertension 09/23/2013  . 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 Nasal polyp     Past Surgical History:  Procedure Laterality Date  . cyst excision,wrist      Social History   Socioeconomic History  . Marital status: Married    Spouse name: Not on file  . Number of children: 2  . Years of education: 55  . Highest education level: Not on file  Occupational History  . 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 Financial resource strain: Not on file  . Food insecurity:    Worry: Not on file    Inability: Not on file  . Transportation needs:    Medical: Not on file    Non-medical: Not on file  Tobacco Use  . Smoking status: Current Every Day Smoker    Packs/day: 1.00    Years: 33.00    Pack years: 33.00    Types: Cigarettes  . Smokeless tobacco: Current User  . Tobacco comment: ~1 ppd   Substance and Sexual Activity  . Alcohol  use: Yes    Alcohol/week: 3.6 oz    Types: 6 Cans of beer per week  . Drug use: No  . Sexual activity: Yes    Partners: Female  Lifestyle  . Physical activity:    Days per week: Not on file    Minutes per session: Not on file  . Stress: Not on file  Relationships  . Social connections:    Talks on phone: Not on file    Gets together: Not on file    Attends religious service: Not on file    Active member of club or organization: Not on file    Attends meetings of clubs or organizations: Not on file    Relationship status: Not on file  . Intimate partner violence:    Fear of current or ex partner: Not on file    Emotionally abused: Not on file    Physically abused: Not on file    Forced sexual activity: Not on file  Other Topics Concern  . Not on file  Social History Narrative   HSG, [place] - [employer]. Married '82. 1 daughter, 1 son. 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 Marriage in good health     Family History  Problem Relation  Age of Onset  . COPD Father   . Diabetes Neg Hx   . Heart disease Neg Hx   . Cancer Neg Hx   . Colon cancer Neg Hx      Allergies as of 09/26/2017      Reactions   Ampicillin       Medication List        Accurate as of 09/26/17  4:20 PM. Always use your most recent med list.          amLODipine 10 MG tablet Commonly known as:  NORVASC Take 1 tablet (10 mg total) by mouth daily.   aspirin EC 81 MG tablet Take 81 mg by mouth daily.   azelastine 0.1 % nasal spray Commonly known as:  ASTELIN Place 1 spray into both nostrils 2 (two) times daily. Use in each nostril as directed   fluticasone 50 MCG/ACT nasal spray Commonly known as:  FLONASE Place 2 sprays into both nostrils daily.   losartan 100 MG tablet Commonly known as:  COZAAR Take 1 tablet (100 mg total) by mouth daily.   tadalafil 20 MG tablet Commonly known as:  ADCIRCA/CIALIS Take 1 tablet (20 mg total) by mouth every other day as  needed for erectile dysfunction.   varenicline 1 MG tablet Commonly known as:  CHANTIX Take 1 tablet (1 mg total) by mouth 2 (two) times daily.   varenicline 0.5 MG X 11 & 1 MG X 42 tablet Commonly known as:  CHANTIX STARTING MONTH PAK Take one 0.5 mg tablet by mouth once daily for 3 days, then increase to one 0.5 mg tablet twice daily for 4 days, then increase to one 1 mg tablet twice daily.   ZANTAC 75 75 MG tablet Generic drug:  ranitidine Take 75 mg by mouth 2 (two) times daily as needed for heartburn.          Objective:   Physical Exam  Musculoskeletal:       Arms:  BP 126/84 (BP Location: Left Arm, Patient Position: Sitting, Cuff Size: Normal)   Pulse 86   Temp 97.6 F (36.4 C) (Oral)   Resp 14   Ht 5\' 11"  (1.803 m)   Wt 227 lb (103 kg)   SpO2 97%   BMI 31.66 kg/m   General: Well developed, NAD, see BMI.  Neck: No  thyromegaly  HEENT:  Normocephalic . Face symmetric, atraumatic Lungs:  CTA B Normal respiratory effort, no intercostal retractions, no accessory muscle use. Heart: RRR,  no murmur.  Minimal/trace peri-ankle edema bilaterally  Abdomen:  Not distended, soft, non-tender. No rebound or rigidity.   Skin: Exposed areas without rash. Not pale. Not jaundice MSK: Hands and wrists without synovitis, mild click at the base of the right thumb sometimes with flexion extension. Rectal: External abnormalities: none. Normal sphincter tone. No rectal masses or tenderness.  Brown stools Prostate: Prostate gland firm and smooth, no enlargement, nodularity, tenderness, mass, asymmetry or induration Neurologic:  alert & oriented X3.  Speech normal, gait appropriate for age and unassisted Strength symmetric and appropriate for age.  Psych: Cognition and judgment appear intact.  Cooperative with normal attention span and concentration.  Behavior appropriate. No anxious or depressed appearing.     Assessment & Plan:   Assessment  HTN GERD ED Nasal  polyp H/o meningioma, R optic never sheet, XRT, f/u @ [facility] DERM: BCC, pre-cancer Nevus  PLAN HTN: Continue amlodipine, losartan.  Controlled GERD: On Zantac as needed History of meningioma, follow-up @[facility]   every 5 years Peri-ankle edema: Recommend compression stocking for long travels.  Seek medical attention if he has a unilateral leg swelling. MSK: Mild elbow pain, see physical exam, occasional pain at the thumb.  Symptoms are not bothersome, will call when/if ready to see Ortho or sports medicine. RTC 1 year

## 2017-09-26 NOTE — Progress Notes (Signed)
Pre visit review using our clinic review tool, if applicable. No additional management support is needed unless otherwise documented below in the visit note. 

## 2017-09-26 NOTE — Assessment & Plan Note (Signed)
HTN: Continue amlodipine, losartan.  Controlled GERD: On Zantac as needed History of meningioma, follow-up @[facility]   every 5 years Peri-ankle edema: Recommend compression stocking for long travels.  Seek medical attention if he has a unilateral leg swelling. MSK: Mild elbow pain, see physical exam, occasional pain at the thumb.  Symptoms are not bothersome, will call when/if ready to see Ortho or sports medicine. RTC 1 year

## 2017-09-30 ENCOUNTER — Ambulatory Visit (HOSPITAL_BASED_OUTPATIENT_CLINIC_OR_DEPARTMENT_OTHER)
Admission: RE | Admit: 2017-09-30 | Discharge: 2017-09-30 | Disposition: A | Discharge status: Home / Self Care | Payer: BLUE CROSS/BLUE SHIELD | Source: Ambulatory Visit | Attending: Internal Medicine | Admitting: Internal Medicine

## 2017-09-30 DIAGNOSIS — Z720 Tobacco use: Secondary | ICD-10-CM | POA: Diagnosis not present

## 2017-09-30 DIAGNOSIS — Z122 Encounter for screening for malignant neoplasm of respiratory organs: Secondary | ICD-10-CM | POA: Diagnosis present

## 2017-09-30 DIAGNOSIS — I700 Atherosclerosis of aorta: Secondary | ICD-10-CM | POA: Insufficient documentation

## 2017-09-30 DIAGNOSIS — J439 Emphysema, unspecified: Secondary | ICD-10-CM | POA: Insufficient documentation

## 2017-09-30 DIAGNOSIS — I2510 Atherosclerotic heart disease of native coronary artery without angina pectoris: Secondary | ICD-10-CM | POA: Insufficient documentation

## 2017-10-14 ENCOUNTER — Encounter: Payer: Self-pay | Admitting: Internal Medicine

## 2017-11-03 ENCOUNTER — Other Ambulatory Visit: Payer: Self-pay | Admitting: Internal Medicine

## 2017-11-03 DIAGNOSIS — I10 Essential (primary) hypertension: Secondary | ICD-10-CM

## 2018-05-03 ENCOUNTER — Encounter: Payer: Self-pay | Admitting: Internal Medicine

## 2018-05-04 MED ORDER — FLUTICASONE PROPIONATE 50 MCG/ACT NA SUSP: 2.0000 | Freq: Every day | NASAL | 5 refills | Status: AC

## 2018-09-29 ENCOUNTER — Encounter: Payer: BLUE CROSS/BLUE SHIELD | Admitting: Internal Medicine

## 2018-12-10 ENCOUNTER — Encounter: Payer: Self-pay | Admitting: Internal Medicine

## 2018-12-10 DIAGNOSIS — I10 Essential (primary) hypertension: Secondary | ICD-10-CM

## 2018-12-11 MED ORDER — AMLODIPINE BESYLATE 10 MG PO TABS: 10.0000 mg | ORAL_TABLET | Freq: Every day | ORAL | 0 refills | Status: DC

## 2018-12-11 MED ORDER — LOSARTAN POTASSIUM 100 MG PO TABS: 100.0000 mg | ORAL_TABLET | Freq: Every day | ORAL | 0 refills | Status: DC

## 2018-12-17 ENCOUNTER — Other Ambulatory Visit: Payer: Self-pay

## 2018-12-18 ENCOUNTER — Other Ambulatory Visit: Payer: Self-pay

## 2018-12-18 ENCOUNTER — Ambulatory Visit (INDEPENDENT_AMBULATORY_CARE_PROVIDER_SITE_OTHER): Payer: No Typology Code available for payment source | Admitting: Internal Medicine

## 2018-12-18 ENCOUNTER — Encounter: Payer: Self-pay | Admitting: Internal Medicine

## 2018-12-18 VITALS — BP 160/100 | HR 85 | Temp 97.1°F | Resp 16 | Ht 71.0 in | Wt 234.2 lb

## 2018-12-18 DIAGNOSIS — I10 Essential (primary) hypertension: Secondary | ICD-10-CM

## 2018-12-18 DIAGNOSIS — Z0000 Encounter for general adult medical examination without abnormal findings: Secondary | ICD-10-CM | POA: Diagnosis not present

## 2018-12-18 LAB — COMPREHENSIVE METABOLIC PANEL
ALT: 20 U/L (ref 0–53)
AST: 10 U/L (ref 0–37)
Albumin: 4.5 g/dL (ref 3.5–5.2)
Alkaline Phosphatase: 93 U/L (ref 39–117)
BUN: 10 mg/dL (ref 6–23)
CO2: 29 mEq/L (ref 19–32)
Calcium: 10.1 mg/dL (ref 8.4–10.5)
Chloride: 100 mEq/L (ref 96–112)
Creatinine, Ser: 0.81 mg/dL (ref 0.40–1.50)
GFR: 97.17 mL/min (ref >60.00)
Glucose, Bld: 101 mg/dL — ABNORMAL HIGH (ref 70–99)
Potassium: 4.9 mEq/L (ref 3.5–5.1)
Sodium: 137 mEq/L (ref 135–145)
Total Bilirubin: 0.6 mg/dL (ref 0.2–1.2)
Total Protein: 6.9 g/dL (ref 6.0–8.3)

## 2018-12-18 LAB — CBC WITH DIFFERENTIAL/PLATELET
Basophils Absolute: 0.1 10*3/uL (ref 0.0–0.1)
Basophils Relative: 0.9 % (ref 0.0–3.0)
Eosinophils Absolute: 0.4 10*3/uL (ref 0.0–0.7)
Eosinophils Relative: 5.4 % — ABNORMAL HIGH (ref 0.0–5.0)
HCT: 41.6 % (ref 39.0–52.0)
Hemoglobin: 13.9 g/dL (ref 13.0–17.0)
Lymphocytes Relative: 29.6 % (ref 12.0–46.0)
Lymphs Abs: 2.2 10*3/uL (ref 0.7–4.0)
MCHC: 33.4 g/dL (ref 30.0–36.0)
MCV: 95 fl (ref 78.0–100.0)
Monocytes Absolute: 0.5 10*3/uL (ref 0.1–1.0)
Monocytes Relative: 6.8 % (ref 3.0–12.0)
Neutro Abs: 4.3 10*3/uL (ref 1.4–7.7)
Neutrophils Relative %: 57.3 % (ref 43.0–77.0)
Platelets: 337 10*3/uL (ref 150.0–400.0)
RBC: 4.38 Mil/uL (ref 4.22–5.81)
RDW: 12.9 % (ref 11.5–15.5)
WBC: 7.5 10*3/uL (ref 4.0–10.5)

## 2018-12-18 LAB — LIPID PANEL
Cholesterol: 208 mg/dL — ABNORMAL HIGH (ref 0–200)
HDL: 71.4 mg/dL (ref >39.00)
LDL Cholesterol: 117 mg/dL — ABNORMAL HIGH (ref 0–99)
NonHDL: 136.52
Total CHOL/HDL Ratio: 3
Triglycerides: 97 mg/dL (ref 0.0–149.0)
VLDL: 19.4 mg/dL (ref 0.0–40.0)

## 2018-12-18 MED ORDER — SHINGRIX 50 MCG/0.5ML IM SUSR: 0.5000 mL | Freq: Once | INTRAMUSCULAR | 1 refills | Status: AC

## 2018-12-18 MED ORDER — VARENICLINE TARTRATE 1 MG PO TABS: 1.0000 mg | ORAL_TABLET | Freq: Two times a day (BID) | ORAL | 2 refills | Status: DC

## 2018-12-18 MED ORDER — AMLODIPINE BESYLATE 10 MG PO TABS: 10.0000 mg | ORAL_TABLET | Freq: Every day | ORAL | 1 refills | Status: DC

## 2018-12-18 MED ORDER — CARVEDILOL 6.25 MG PO TABS: 6.2500 mg | ORAL_TABLET | Freq: Two times a day (BID) | ORAL | 3 refills | Status: DC

## 2018-12-18 MED ORDER — LOSARTAN POTASSIUM 100 MG PO TABS: 100.0000 mg | ORAL_TABLET | Freq: Every day | ORAL | 1 refills | Status: DC

## 2018-12-18 MED ORDER — CHANTIX STARTING MONTH PAK 0.5 MG X 11 & 1 MG X 42 PO TABS: ORAL_TABLET | ORAL | 0 refills | Status: DC

## 2018-12-18 NOTE — Assessment & Plan Note (Signed)
-  Td  09/2016 - shingrex d/w pt : printed Rx for pt   -had a flu shot  -Prostate cancer screening: No family history, DRE /PSA wnl 2019 -CCS: Cscope 04-2016, polyps, 5 years -Heavy smoker, had his first low-dose lung CAT scan last year, repeat  . -labs:  CMP, FLP, CBC, -Diet and exercise discussed  -Tobacco: Still smoking, he is now willing to try Chantix.  Prescriptions provided.

## 2018-12-18 NOTE — Progress Notes (Signed)
Pre visit review using our clinic review tool, if applicable. No additional management support is needed unless otherwise documented below in the visit note. 

## 2018-12-18 NOTE — Progress Notes (Signed)
Subjective:    Patient ID: Colin Watson, male    DOB: [birth year], 58 y.o.   MRN: [id number]  DOS:  12/18/2018 Type of visit - description: CPX BP noted to be elevated.  Reports good compliance with medications.  Review of Systems Reports that he is active playing golf without chest pain.  Occasionally gets DOE if he goes up a hill. She has a history of COPD per CT and continue smoking.  Denies cough or wheezing.  Other than above, a 14 point review of systems is negative    Past Medical History:  Diagnosis Date  . Erectile dysfunction   . GERD (gastroesophageal reflux disease)   . Hypertension 09/23/2013  . 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, f/u at [facility]    . Nasal polyp     Past Surgical History:  Procedure Laterality Date  . cyst excision,wrist     Family History  Problem Relation Age of Onset  . COPD Father   . Bone cancer Paternal Grandmother   . Diabetes Neg Hx   . Heart disease Neg Hx   . Colon cancer Neg Hx     Social History   Socioeconomic History  . Marital status: Married    Spouse name: Not on file  . Number of children: 2  . Years of education: 37  . Highest education level: Not on file  Occupational History  . 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 Financial resource strain: Not on file  . Food insecurity    Worry: Not on file    Inability: Not on file  . Transportation needs    Medical: Not on file    Non-medical: Not on file  Tobacco Use  . Smoking status: Current Every Day Smoker    Packs/day: 1.00    Years: 33.00    Pack years: 33.00    Types: Cigarettes  . Smokeless tobacco: Current User  . Tobacco comment: ~1 ppd   Substance and Sexual Activity  . Alcohol use: Yes    Alcohol/week: 6.0 standard drinks    Types: 6 Cans of beer per week  . Drug use: No  . Sexual activity: Yes    Partners: Female  Lifestyle  . Physical activity    Days per week: Not on file     Minutes per session: Not on file  . Stress: Not on file  Relationships  . Social Herbalist on phone: Not on file    Gets together: Not on file    Attends religious service: Not on file    Active member of club or organization: Not on file    Attends meetings of clubs or organizations: Not on file    Relationship status: Not on file  . Intimate partner violence    Fear of current or ex partner: Not on file    Emotionally abused: Not on file    Physically abused: Not on file    Forced sexual activity: Not on file  Other Topics Concern  . Not on file  Social History Narrative   HSG, [place] - [employer]. Married '82. 1 daughter, 1 son. 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 Marriage in good health      Allergies as of 12/18/2018      Reactions   Ampicillin       Medication List  Accurate as of December 18, 2018 11:59 PM. If you have any questions, ask your nurse or doctor.        STOP taking these medications   azelastine 0.1 % nasal spray Commonly known as: ASTELIN Stopped by: Kathlene November, MD   Zantac 75 75 MG tablet Generic drug: ranitidine Stopped by: Kathlene November, MD     TAKE these medications   amLODipine 10 MG tablet Commonly known as: NORVASC Take 1 tablet (10 mg total) by mouth daily.   aspirin EC 81 MG tablet Take 81 mg by mouth daily.   carvedilol 6.25 MG tablet Commonly known as: COREG Take 1 tablet (6.25 mg total) by mouth 2 (two) times daily with a meal. Started by: Kathlene November, MD   Chantix Starting Month Pak 0.5 MG X 11 & 1 MG X 42 tablet Generic drug: varenicline Take one 0.5 mg tablet by mouth once daily for 3 days, then increase to one 0.5 mg tablet twice daily for 4 days, then increase to one 1 mg tablet twice daily.   varenicline 1 MG tablet Commonly known as: CHANTIX Take 1 tablet (1 mg total) by mouth 2 (two) times daily.   fluticasone 50 MCG/ACT nasal spray Commonly known as: FLONASE Place 2 sprays  into both nostrils daily.   losartan 100 MG tablet Commonly known as: COZAAR Take 1 tablet (100 mg total) by mouth daily.   Shingrix injection Generic drug: Zoster Vaccine Adjuvanted Inject 0.5 mLs into the muscle once for 1 dose. Started by: Kathlene November, MD   tadalafil 20 MG tablet Commonly known as: CIALIS Take 1 tablet (20 mg total) by mouth every other day as needed for erectile dysfunction.           Objective:   Physical Exam BP (!) 160/100 (BP Location: Right Arm)   Pulse 85   Temp (!) 97.1 F (36.2 C) (Temporal)   Resp 16   Ht 5\' 11"  (1.803 m)   Wt 234 lb 4 oz (106.3 kg)   SpO2 100%   BMI 32.67 kg/m  General: Well developed, NAD, BMI noted Neck: No  thyromegaly  HEENT:  Normocephalic . Face symmetric, atraumatic Lungs:  CTA B Normal respiratory effort, no intercostal retractions, no accessory muscle use. Heart: RRR,  no murmur.  No pretibial edema bilaterally  Abdomen:  Not distended, soft, non-tender. No rebound or rigidity.   Skin: Exposed areas without rash. Not pale. Not jaundice Neurologic:  alert & oriented X3.  Speech normal, gait appropriate for age and unassisted Strength symmetric and appropriate for age.  Psych: Cognition and judgment appear intact.  Cooperative with normal attention span and concentration.  Behavior appropriate. No anxious or depressed appearing.     Assessment     Assessment  HTN GERD COPD per CT 2019 ED Nasal polyp H/o meningioma, R optic never sheet, XRT, f/u @ [facility] DERM: BCC, pre-cancer Nevus  PLAN HTN: BP elevated when he arrived, I rechecked it myself 160/100, currently on losartan 100 mg and amlodipine 10 mg.  EKG today: NSR, no acute changes, similar to previous EKG. Add carvedilol 6.25 mg twice a day. Monitor BPs at home nurse visit in 4 weeks. Coronary calcifications: Noted on CT last year, will check a FLP and start statins.  Benefits discussed with the patient.  Currently asymptomatic.  10-year cardiovascular risk 22.5%.  COPD: Smoker, changes consistent with COPD noted on CT chest 2019.  Currently with no symptoms. RTC 4 weeks for nurse visit (BP  check, AST, ALT, FLP) Next visit with me 3 months

## 2018-12-18 NOTE — Patient Instructions (Signed)
GO TO THE LAB : Get the blood work     GO TO THE FRONT DESK Schedule your next appointment for a nurse visit 4 weeks from today.  You will need your blood pressure check and blood work  Schedule appointment with me in 3 months  ==  Continue same medications carvedilol, is a blood pressure medication.   ==  Check the  blood pressure 2 or 3 times a week BP GOAL is between 110/65 and  135/85. If it is consistently higher or lower, let me know   Call with your blood pressure readings in 10 days.  ==  Start Chantix at your convenience.  Quit 2 weeks later   Steps to Quit Smoking Smoking tobacco is the leading cause of preventable death. It can affect almost every organ in the body. Smoking puts you and those around you at risk for developing many serious chronic diseases. Quitting smoking can be difficult, but it is one of the best things that you can do for your health. It is never too late to quit. How do I get ready to quit? When you decide to quit smoking, create a plan to help you succeed. Before you quit:  Pick a date to quit. Set a date within the next 2 weeks to give you time to prepare.  Write down the reasons why you are quitting. Keep this list in places where you will see it often.  Tell your family, friends, and co-workers that you are quitting. Support from your loved ones can make quitting easier.  Talk with your health care provider about your options for quitting smoking.  Find out what treatment options are covered by your health insurance.  Identify people, places, things, and activities that make you want to smoke (triggers). Avoid them. What first steps can I take to quit smoking?  Throw away all cigarettes at home, at work, and in your car.  Throw away smoking accessories, such as Scientist, research (medical).  Clean your car. Make sure to empty the ashtray.  Clean your home, including curtains and carpets. What strategies can I use to quit smoking? Talk  with your health care provider about combining strategies, such as taking medicines while you are also receiving in-person counseling. Using these two strategies together makes you more likely to succeed in quitting than if you used either strategy on its own.  If you are pregnant or breastfeeding, talk with your health care provider about finding counseling or other support strategies to quit smoking. Do not take medicine to help you quit smoking unless your health care provider tells you to do so. To quit smoking: Quit right away  Quit smoking completely, instead of gradually reducing how much you smoke over a period of time. Research shows that stopping smoking right away is more successful than gradually quitting.  Attend in-person counseling to help you build problem-solving skills. You are more likely to succeed in quitting if you attend counseling sessions regularly. Even short sessions of 10 minutes can be effective. Take medicine You may take medicines to help you quit smoking. Some medicines require a prescription and some you can purchase over-the-counter. Medicines may have nicotine in them to replace the nicotine in cigarettes. Medicines may:  Help to stop cravings.  Help to relieve withdrawal symptoms. Your health care provider may recommend:  Nicotine patches, gum, or lozenges.  Nicotine inhalers or sprays.  Non-nicotine medicine that is taken by mouth. Find resources Find resources and support systems that  can help you to quit smoking and remain smoke-free after you quit. These resources are most helpful when you use them often. They include:  Online chats with a Social worker.  Telephone quitlines.  Printed Furniture conservator/restorer.  Support groups or group counseling.  Text messaging programs.  Mobile phone apps or applications. Use apps that can help you stick to your quit plan by providing reminders, tips, and encouragement. There are many free apps for mobile devices as  well as websites. Examples include Quit Guide from the [employer] and smokefree.gov What things can I do to make it easier to quit?   Reach out to your family and friends for support and encouragement. Call telephone quitlines (1-800-QUIT-NOW), reach out to support groups, or work with a counselor for support.  Ask people who smoke to avoid smoking around you.  Avoid places that trigger you to smoke, such as bars, parties, or smoke-break areas at work.  Spend time with people who do not smoke.  Lessen the stress in your life. Stress can be a smoking trigger for some people. To lessen stress, try: ? Exercising regularly. ? Doing deep-breathing exercises. ? Doing yoga. ? Meditating. ? Performing a body scan. This involves closing your eyes, scanning your body from head to toe, and noticing which parts of your body are particularly tense. Try to relax the muscles in those areas. How will I feel when I quit smoking? Day 1 to 3 weeks Within the first 24 hours of quitting smoking, you may start to feel withdrawal symptoms. These symptoms are usually most noticeable 2-3 days after quitting, but they usually do not last for more than 2-3 weeks. You may experience these symptoms:  Mood swings.  Restlessness, anxiety, or irritability.  Trouble concentrating.  Dizziness.  Strong cravings for sugary foods and nicotine.  Mild weight gain.  Constipation.  Nausea.  Coughing or a sore throat.  Changes in how the medicines that you take for unrelated issues work in your body.  Depression.  Trouble sleeping (insomnia). Week 3 and afterward After the first 2-3 weeks of quitting, you may start to notice more positive results, such as:  Improved sense of smell and taste.  Decreased coughing and sore throat.  Slower heart rate.  Lower blood pressure.  Clearer skin.  The ability to breathe more easily.  Fewer sick days. Quitting smoking can be very challenging. Do not get discouraged  if you are not successful the first time. Some people need to make many attempts to quit before they achieve long-term success. Do your best to stick to your quit plan, and talk with your health care provider if you have any questions or concerns. Summary  Smoking tobacco is the leading cause of preventable death. Quitting smoking is one of the best things that you can do for your health.  When you decide to quit smoking, create a plan to help you succeed.  Quit smoking right away, not slowly over a period of time.  When you start quitting, seek help from your health care provider, family, or friends. This information is not intended to replace advice given to you by your health care provider. Make sure you discuss any questions you have with your health care provider. Document Released: 02/19/2001 Document Revised: 05/15/2018 Document Reviewed: 05/16/2018 Elsevier Patient Education  2020 [employer].

## 2018-12-20 NOTE — Assessment & Plan Note (Signed)
HTN: BP elevated when he arrived, I rechecked it myself 160/100, currently on losartan 100 mg and amlodipine 10 mg.  EKG today: NSR, no acute changes, similar to previous EKG. Add carvedilol 6.25 mg twice a day. Monitor BPs at home nurse visit in 4 weeks. Coronary calcifications: Noted on CT last year, will check a FLP and start statins.  Benefits discussed with the patient.  Currently asymptomatic. 10-year cardiovascular risk 22.5%.  COPD: Smoker, changes consistent with COPD noted on CT chest 2019.  Currently with no symptoms. RTC 4 weeks for nurse visit (BP check, AST, ALT, FLP) Next visit with me 3 months

## 2018-12-21 ENCOUNTER — Other Ambulatory Visit: Payer: Self-pay

## 2018-12-21 DIAGNOSIS — Z720 Tobacco use: Secondary | ICD-10-CM

## 2018-12-21 DIAGNOSIS — Z122 Encounter for screening for malignant neoplasm of respiratory organs: Secondary | ICD-10-CM

## 2018-12-21 MED ORDER — ATORVASTATIN CALCIUM 20 MG PO TABS: 20.0000 mg | ORAL_TABLET | Freq: Every day | ORAL | 3 refills | Status: DC

## 2018-12-21 NOTE — Addendum Note (Signed)
Addended byDamita Dunnings D on: 12/21/2018 08:13 AM   Modules accepted: Orders

## 2019-01-15 ENCOUNTER — Other Ambulatory Visit: Payer: Self-pay

## 2019-01-15 ENCOUNTER — Ambulatory Visit (INDEPENDENT_AMBULATORY_CARE_PROVIDER_SITE_OTHER): Payer: No Typology Code available for payment source | Admitting: Internal Medicine

## 2019-01-15 ENCOUNTER — Other Ambulatory Visit (INDEPENDENT_AMBULATORY_CARE_PROVIDER_SITE_OTHER): Payer: No Typology Code available for payment source

## 2019-01-15 VITALS — BP 148/91 | HR 73

## 2019-01-15 DIAGNOSIS — E785 Hyperlipidemia, unspecified: Secondary | ICD-10-CM

## 2019-01-15 LAB — LIPID PANEL
Cholesterol: 161 mg/dL (ref 0–200)
HDL: 70.1 mg/dL (ref >39.00)
LDL Cholesterol: 74 mg/dL (ref 0–99)
NonHDL: 90.49
Total CHOL/HDL Ratio: 2
Triglycerides: 84 mg/dL (ref 0.0–149.0)
VLDL: 16.8 mg/dL (ref 0.0–40.0)

## 2019-01-15 MED ORDER — CARVEDILOL 12.5 MG PO TABS: 12.5000 mg | ORAL_TABLET | Freq: Two times a day (BID) | ORAL | 3 refills | Status: DC

## 2019-01-15 NOTE — Progress Notes (Signed)
Pt here for Blood pressure check per  Dr. Larose Kells  Pt currently takes: amlodipine 10mg , losartan 100mg , carvedilol 6.25mg    Pt reports compliance with medication. Has not been checking at home because he does not have a machine.  He is willing to purchase one.  BP today Right Arm 152/87  HR: 72 Left Arm 141/88 HR 75  Recheck Right arm 155/80 HR 76 Left Arm 148/91 HR 73   Pt advised per  Dr. Larose Kells Coreg 12.5 bid, no charge for visit today.  Come back in 1 month for bp check with nurse. Also need to get blood pressure cuff so he can check at home.    Patient scheduled for next month and he will bring in logs.

## 2019-01-18 ENCOUNTER — Other Ambulatory Visit: Payer: Self-pay | Admitting: Emergency Medicine

## 2019-01-18 ENCOUNTER — Other Ambulatory Visit: Payer: No Typology Code available for payment source

## 2019-01-18 DIAGNOSIS — E785 Hyperlipidemia, unspecified: Secondary | ICD-10-CM

## 2019-01-18 LAB — HEPATIC FUNCTION PANEL
AG Ratio: 2.3 (calc) (ref 1.0–2.5)
ALT: 22 U/L (ref 9–46)
AST: 15 U/L (ref 10–35)
Albumin: 4.5 g/dL (ref 3.6–5.1)
Alkaline phosphatase (APISO): 91 U/L (ref 35–144)
Bilirubin, Direct: 0.1 mg/dL (ref 0.0–0.2)
Globulin: 2 g/dL (calc) (ref 1.9–3.7)
Indirect Bilirubin: 0.5 mg/dL (calc) (ref 0.2–1.2)
Total Bilirubin: 0.6 mg/dL (ref 0.2–1.2)
Total Protein: 6.5 g/dL (ref 6.1–8.1)

## 2019-01-18 NOTE — Addendum Note (Signed)
Addended by: Caffie Pinto on: 01/18/2019 10:32 AM   Modules accepted: Orders

## 2019-02-16 ENCOUNTER — Ambulatory Visit (INDEPENDENT_AMBULATORY_CARE_PROVIDER_SITE_OTHER): Payer: No Typology Code available for payment source | Admitting: Internal Medicine

## 2019-02-16 ENCOUNTER — Other Ambulatory Visit: Payer: Self-pay

## 2019-02-16 DIAGNOSIS — I10 Essential (primary) hypertension: Secondary | ICD-10-CM | POA: Diagnosis not present

## 2019-02-16 MED ORDER — CARVEDILOL 25 MG PO TABS: 25.0000 mg | ORAL_TABLET | Freq: Two times a day (BID) | ORAL | 3 refills | Status: DC

## 2019-02-16 NOTE — Progress Notes (Addendum)
Pt here for Blood pressure check per   Pt currently takes: increased carvedilol from 6.25 to carvedilol 12.5 mg daily, amlodipine 10 mg, losartan 100 mg missed a couple of carvedilol at night but is still getting used to it.    Pt reports compliance with medication.   BP today @ = 151/83 HR =74  Pt advised per Dr. Larose Kells increase carvedilol to 25 mg bid. New rx has been sent to the pharmacy. Advised patient to check bp at least twice a day and send in readings via mychart.   Kathlene November, MD

## 2019-03-10 ENCOUNTER — Ambulatory Visit (INDEPENDENT_AMBULATORY_CARE_PROVIDER_SITE_OTHER)
Admission: RE | Admit: 2019-03-10 | Discharge: 2019-03-10 | Disposition: A | Discharge status: Home / Self Care | Payer: No Typology Code available for payment source | Source: Ambulatory Visit

## 2019-03-10 DIAGNOSIS — J069 Acute upper respiratory infection, unspecified: Secondary | ICD-10-CM

## 2019-03-10 NOTE — ED Provider Notes (Signed)
Virtual Visit via Video Note:  Colin Watson  initiated request for Telemedicine visit with [facility] team. I connected with Colin Watson  on 03/10/2019 at 5:16 PM  for a synchronized telemedicine visit using a video enabled HIPPA compliant telemedicine application. I verified that I am speaking with Colin Watson  using two identifiers. Sharion Balloon, NP  was physically located in a [facility] site and Colin Watson was located at a different location.   The limitations of evaluation and management by telemedicine as well as the availability of in-person appointments were discussed. Patient was informed that he  may incur a bill ( including co-pay) for this virtual visit encounter. Colin Watson  expressed understanding and gave verbal consent to proceed with virtual visit.     History of Present Illness:Colin Watson  is a 58 y.o. male presents for evaluation of 3 day history of sinus congestion.  Today patient had feeling of both ears being "clogged"; taking Mucinex with moderate improvement.  He denies fever, chills, sore throat, cough, shortness of breath, or other symptoms.     Allergies  Allergen Reactions  . Ampicillin      Past Medical History:  Diagnosis Date  . Erectile dysfunction   . GERD (gastroesophageal reflux disease)   . Hypertension 09/23/2013  . 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 Nasal polyp      Social History   Tobacco Use  . Smoking status: Current Every Day Smoker    Packs/day: 1.00    Years: 33.00    Pack years: 33.00    Types: Cigarettes  . Smokeless tobacco: Current User  . Tobacco comment: ~1 ppd   Substance Use Topics  . Alcohol use: Yes    Alcohol/week: 6.0 standard drinks    Types: 6 Cans of beer per week  . Drug use: No        Observations/Objective: Physical Exam  VITALS: Patient denies fever. GENERAL: Alert, appears well and in no acute  distress. HEENT: Atraumatic. NECK: Normal movements of the head and neck. CARDIOPULMONARY: No increased WOB. Speaking in clear sentences. I:E ratio WNL.  MS: Moves all visible extremities without noticeable abnormality. PSYCH: Pleasant and cooperative, well-groomed. Speech normal rate and rhythm. Affect is appropriate. Insight and judgement are appropriate. Attention is focused, linear, and appropriate.  NEURO: CN grossly intact. Oriented as arrived to appointment on time with no prompting. Moves both UE equally.  SKIN: No obvious lesions, wounds, erythema, or cyanosis noted on face or hands.   Assessment and Plan:    ICD-10-CM   1. Upper respiratory tract infection, unspecified type  J06.9        Follow Up Instructions: Instructed patient to continue using Mucinex and Flonase, as his symptoms are improving with these medications.  Discussed with him that he can come here to be seen in person, schedule another video visit, or follow-up with his PCP if his symptoms are not continuing to improve.  Patient agrees to this plan of care.      I discussed the assessment and treatment plan with the patient. The patient was provided an opportunity to ask questions and all were answered. The patient agreed with the plan and demonstrated an understanding of the instructions.   The patient was advised to call back or seek an in-person evaluation if the symptoms worsen or  if the condition fails to improve as anticipated.      Sharion Balloon, NP  03/10/2019 5:16 PM         Sharion Balloon, NP 03/10/19 [phone]

## 2019-03-10 NOTE — Discharge Instructions (Addendum)
Continue using the Flonase nasal spray and taking the over-the-counter Mucinex.    Come here to be seen in person, schedule another video visit, or follow-up with your primary care provider if your symptoms are not improving.

## 2019-03-17 ENCOUNTER — Ambulatory Visit: Payer: No Typology Code available for payment source | Attending: Internal Medicine

## 2019-03-17 DIAGNOSIS — Z20822 Contact with and (suspected) exposure to covid-19: Secondary | ICD-10-CM

## 2019-03-18 ENCOUNTER — Telehealth: Payer: Self-pay

## 2019-03-18 LAB — NOVEL CORONAVIRUS, NAA: SARS-CoV-2, NAA: DETECTED — AB

## 2019-03-18 NOTE — Telephone Encounter (Signed)
Pt. Called back and given COVID 19. Verbalizes understanding. Will quarantine x 10 days from beginning of symptoms. Reviewed home safety and when to seek medical attention.

## 2019-03-19 ENCOUNTER — Other Ambulatory Visit: Payer: Self-pay | Admitting: Internal Medicine

## 2019-03-19 ENCOUNTER — Ambulatory Visit: Payer: No Typology Code available for payment source | Admitting: Internal Medicine

## 2019-04-01 ENCOUNTER — Other Ambulatory Visit: Payer: Self-pay

## 2019-04-02 ENCOUNTER — Encounter: Payer: Self-pay | Admitting: Internal Medicine

## 2019-04-02 ENCOUNTER — Other Ambulatory Visit: Payer: Self-pay

## 2019-04-02 ENCOUNTER — Ambulatory Visit (INDEPENDENT_AMBULATORY_CARE_PROVIDER_SITE_OTHER): Payer: No Typology Code available for payment source | Admitting: Internal Medicine

## 2019-04-02 VITALS — BP 145/95 | HR 76 | Temp 96.9°F | Resp 16 | Ht 71.0 in | Wt 239.4 lb

## 2019-04-02 DIAGNOSIS — L609 Nail disorder, unspecified: Secondary | ICD-10-CM

## 2019-04-02 DIAGNOSIS — I10 Essential (primary) hypertension: Secondary | ICD-10-CM | POA: Diagnosis not present

## 2019-04-02 DIAGNOSIS — Z720 Tobacco use: Secondary | ICD-10-CM

## 2019-04-02 NOTE — Progress Notes (Signed)
   Subjective:    Patient ID: TENNISON SLOMKA, male    DOB: Apr 18, 1960, 59 y.o.   MRN: [id number]  DOS:  04/02/2019 Type of visit - description: f/u Today we addressed his blood pressure, tobacco abuse, cholesterol. He also has a dark lesion on the left great toe for several months, thinks it is a fungal infection  Was diagnosed with Covid, had URI symptoms 03/10/2019, tested + 03/17/2019, had mild symptoms, completely recuperated at this point.  Review of Systems See above   Past Medical History:  Diagnosis Date  . Erectile dysfunction   . GERD (gastroesophageal reflux disease)   . Hypertension 09/23/2013  . 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 Nasal polyp     Past Surgical History:  Procedure Laterality Date  . cyst excision,wrist          Objective:   Physical Exam BP (!) 145/95 (BP Location: Left Arm)   Pulse 76   Temp (!) 96.9 F (36.1 C) (Temporal)   Resp 16   Ht 5\' 11"  (1.803 m)   Wt 239 lb 6 oz (108.6 kg)   SpO2 100%   BMI 33.39 kg/m  General:   Well developed, NAD, BMI noted. HEENT:  Normocephalic . Face symmetric, atraumatic Lungs:  CTA B Normal respiratory effort, no intercostal retractions, no accessory muscle use. Heart: RRR,  no murmur.  No pretibial edema bilaterally  Skin: Left great toenail, external aspect has a line of dark slightly dystrophic nail Neurologic:  alert & oriented X3.  Speech normal, gait appropriate for age and unassisted Psych--  Cognition and judgment appear intact.  Cooperative with normal attention span and concentration.  Behavior appropriate. No anxious or depressed appearing.       Assessment    Assessment  HTN Hyperlipidemia:  GERD COPD per CT 2019 ED Nasal polyp H/o meningioma, R optic never sheet, XRT, f/u @ [facility] DERM: BCC, pre-cancer Nevus  PLAN HTN: Currently on amlodipine, carvedilol, losartan.  Ambulatory BPs typically [id number];  diastolic occasionally in the 90s.  I checked myself and obtain 145/95. We agreed on work on diet (low salt) and exercise, monitor at home, send me a message with ambulatory readings next month and consider adjust his meds High cholesterol: On atorvastatin, doing well.. Tobacco: Still smoking but thinking seriously about quitting, I encouraged him to use the Chantix prescription, counseling the best I could. Left great toenail lesion: Could be just a fungal infection but the darkness of the toenail is pretty deep, I will refer him to podiatry for further evaluation RTC 6 months CPX  This visit occurred during the SARS-CoV-2 public health emergency.  Safety protocols were in place, including screening questions prior to the visit, additional usage of staff PPE, and extensive cleaning of exam room while observing appropriate contact time as indicated for disinfecting solutions.

## 2019-04-02 NOTE — Patient Instructions (Signed)
GO TO THE FRONT DESK Schedule your next appointment  6 months for a physical     Check your BPs twice a week BP GOAL is between 110/65 and  135/85. If it is consistently higher or lower, let me know    DASH Eating Plan DASH stands for "Dietary Approaches to Stop Hypertension." The DASH eating plan is a healthy eating plan that has been shown to reduce high blood pressure (hypertension). It may also reduce your risk for type 2 diabetes, heart disease, and stroke. The DASH eating plan may also help with weight loss. What are tips for following this plan?  General guidelines  Avoid eating more than 2,300 mg (milligrams) of salt (sodium) a day. If you have hypertension, you may need to reduce your sodium intake to 1,500 mg a day.  Limit alcohol intake to no more than 1 drink a day for nonpregnant women and 2 drinks a day for men. One drink equals 12 oz of beer, 5 oz of wine, or 1 oz of hard liquor.  Work with your health care provider to maintain a healthy body weight or to lose weight. Ask what an ideal weight is for you.  Get at least 30 minutes of exercise that causes your heart to beat faster (aerobic exercise) most days of the week. Activities may include walking, swimming, or biking.  Work with your health care provider or diet and nutrition specialist (dietitian) to adjust your eating plan to your individual calorie needs. Reading food labels   Check food labels for the amount of sodium per serving. Choose foods with less than 5 percent of the Daily Value of sodium. Generally, foods with less than 300 mg of sodium per serving fit into this eating plan.  To find whole grains, look for the word "whole" as the first word in the ingredient list. Shopping  Buy products labeled as "low-sodium" or "no salt added."  Buy fresh foods. Avoid canned foods and premade or frozen meals. Cooking  Avoid adding salt when cooking. Use salt-free seasonings or herbs instead of table salt or  sea salt. Check with your health care provider or pharmacist before using salt substitutes.  Do not fry foods. Cook foods using healthy methods such as baking, boiling, grilling, and broiling instead.  Cook with heart-healthy oils, such as olive, canola, soybean, or sunflower oil. Meal planning  Eat a balanced diet that includes: ? 5 or more servings of fruits and vegetables each day. At each meal, try to fill half of your plate with fruits and vegetables. ? Up to 6-8 servings of whole grains each day. ? Less than 6 oz of lean meat, poultry, or fish each day. A 3-oz serving of meat is about the same size as a deck of cards. One egg equals 1 oz. ? 2 servings of low-fat dairy each day. ? A serving of nuts, seeds, or beans 5 times each week. ? Heart-healthy fats. Healthy fats called Omega-3 fatty acids are found in foods such as flaxseeds and coldwater fish, like sardines, salmon, and mackerel.  Limit how much you eat of the following: ? Canned or prepackaged foods. ? Food that is high in trans fat, such as fried foods. ? Food that is high in saturated fat, such as fatty meat. ? Sweets, desserts, sugary drinks, and other foods with added sugar. ? Full-fat dairy products.  Do not salt foods before eating.  Try to eat at least 2 vegetarian meals each week.  Eat more  home-cooked food and less restaurant, buffet, and fast food.  When eating at a restaurant, ask that your food be prepared with less salt or no salt, if possible. What foods are recommended? The items listed may not be a complete list. Talk with your dietitian about what dietary choices are best for you. Grains Whole-grain or whole-wheat bread. Whole-grain or whole-wheat pasta. Brown rice. [place]. Bulgur. Whole-grain and low-sodium cereals. Pita bread. Low-fat, low-sodium crackers. Whole-wheat flour tortillas. Vegetables Fresh or frozen vegetables (raw, steamed, roasted, or grilled). Low-sodium or reduced-sodium  tomato and vegetable juice. Low-sodium or reduced-sodium tomato sauce and tomato paste. Low-sodium or reduced-sodium canned vegetables. Fruits All fresh, dried, or frozen fruit. Canned fruit in natural juice (without added sugar). Meat and other protein foods Skinless chicken or [place]. Ground chicken or [place]. Pork with fat trimmed off. Fish and seafood. Egg whites. Dried beans, peas, or lentils. Unsalted nuts, nut butters, and seeds. Unsalted canned beans. Lean cuts of beef with fat trimmed off. Low-sodium, lean deli meat. Dairy Low-fat (1%) or fat-free (skim) milk. Fat-free, low-fat, or reduced-fat cheeses. Nonfat, low-sodium ricotta or cottage cheese. Low-fat or nonfat yogurt. Low-fat, low-sodium cheese. Fats and oils Soft margarine without trans fats. Vegetable oil. Low-fat, reduced-fat, or light mayonnaise and salad dressings (reduced-sodium). Canola, safflower, olive, soybean, and sunflower oils. Avocado. Seasoning and other foods Herbs. Spices. Seasoning mixes without salt. Unsalted popcorn and pretzels. Fat-free sweets. What foods are not recommended? The items listed may not be a complete list. Talk with your dietitian about what dietary choices are best for you. Grains Baked goods made with fat, such as croissants, muffins, or some breads. Dry pasta or rice meal packs. Vegetables Creamed or fried vegetables. Vegetables in a cheese sauce. Regular canned vegetables (not low-sodium or reduced-sodium). Regular canned tomato sauce and paste (not low-sodium or reduced-sodium). Regular tomato and vegetable juice (not low-sodium or reduced-sodium). Angie Fava. Olives. Fruits Canned fruit in a light or heavy syrup. Fried fruit. Fruit in cream or butter sauce. Meat and other protein foods Fatty cuts of meat. Ribs. Fried meat. Berniece Salines. Sausage. Bologna and other processed lunch meats. Salami. Fatback. Hotdogs. Bratwurst. Salted nuts and seeds. Canned beans with added salt. Canned or smoked fish.  Whole eggs or egg yolks. Chicken or [place] with skin. Dairy Whole or 2% milk, cream, and half-and-half. Whole or full-fat cream cheese. Whole-fat or sweetened yogurt. Full-fat cheese. Nondairy creamers. Whipped toppings. Processed cheese and cheese spreads. Fats and oils Butter. Stick margarine. Lard. Shortening. Ghee. Bacon fat. Tropical oils, such as coconut, palm kernel, or palm oil. Seasoning and other foods Salted popcorn and pretzels. Onion salt, garlic salt, seasoned salt, table salt, and sea salt. Worcestershire sauce. Tartar sauce. Barbecue sauce. Teriyaki sauce. Soy sauce, including reduced-sodium. Steak sauce. Canned and packaged gravies. Fish sauce. Oyster sauce. Cocktail sauce. Horseradish that you find on the shelf. Ketchup. Mustard. Meat flavorings and tenderizers. Bouillon cubes. Hot sauce and Tabasco sauce. Premade or packaged marinades. Premade or packaged taco seasonings. Relishes. Regular salad dressings. Where to find more information:  National Heart, Lung, and [place]: https://wilson-eaton.com/  American Heart Association: www.heart.org Summary  The DASH eating plan is a healthy eating plan that has been shown to reduce high blood pressure (hypertension). It may also reduce your risk for type 2 diabetes, heart disease, and stroke.  With the DASH eating plan, you should limit salt (sodium) intake to 2,300 mg a day. If you have hypertension, you may need to reduce your sodium intake to 1,500  mg a day.  When on the DASH eating plan, aim to eat more fresh fruits and vegetables, whole grains, lean proteins, low-fat dairy, and heart-healthy fats.  Work with your health care provider or diet and nutrition specialist (dietitian) to adjust your eating plan to your individual calorie needs. This information is not intended to replace advice given to you by your health care provider. Make sure you discuss any questions you have with your health care provider. Document Revised:  02/07/2017 Document Reviewed: 02/19/2016 Elsevier Patient Education  2020 [employer].

## 2019-04-02 NOTE — Progress Notes (Signed)
Pre visit review using our clinic review tool, if applicable. No additional management support is needed unless otherwise documented below in the visit note. 

## 2019-04-03 NOTE — Assessment & Plan Note (Signed)
HTN: Currently on amlodipine, carvedilol, losartan.  Ambulatory BPs typically [id number]; diastolic occasionally in the 90s.  I checked myself and obtain 145/95. We agreed on work on diet (low salt) and exercise, monitor at home, send me a message with ambulatory readings next month and consider adjust his meds High cholesterol: On atorvastatin, doing well.. Tobacco: Still smoking but thinking seriously about quitting, I encouraged him to use the Chantix prescription, counseling the best I could. Left great toenail lesion: Could be just a fungal infection but the darkness of the toenail is pretty deep, I will refer him to podiatry for further evaluation RTC 6 months CPX

## 2019-04-16 ENCOUNTER — Encounter: Payer: Self-pay | Admitting: Podiatry

## 2019-04-16 ENCOUNTER — Other Ambulatory Visit: Payer: Self-pay

## 2019-04-16 ENCOUNTER — Ambulatory Visit (INDEPENDENT_AMBULATORY_CARE_PROVIDER_SITE_OTHER): Payer: Managed Care, Other (non HMO) | Admitting: Podiatry

## 2019-04-16 VITALS — BP 159/88 | HR 68 | Temp 96.8°F | Resp 16

## 2019-04-16 DIAGNOSIS — L609 Nail disorder, unspecified: Secondary | ICD-10-CM

## 2019-04-16 NOTE — Progress Notes (Signed)
   Subjective:    Patient ID: Colin Watson, male    DOB: 27-Jan-1961, 59 y.o.   MRN: [id number]  HPI    Review of Systems  All other systems reviewed and are negative.      Objective:   Physical Exam        Assessment & Plan:

## 2019-04-16 NOTE — Patient Instructions (Signed)

## 2019-04-21 NOTE — Progress Notes (Signed)
Subjective:   Patient ID: Colin Watson, male   DOB: 59 y.o.   MRN: [id number]   HPI Patient presents with discoloration of the left big toenail stating that been there for at least a year and he does not remember trauma.  It does not hurt he was concerned about the color and he was referred here.  Patient does smoke 1 pack/day and likes to be active   Review of Systems  All other systems reviewed and are negative.       Objective:  Physical Exam Vitals and nursing note reviewed.  Constitutional:      Appearance: He is well-developed.  Pulmonary:     Effort: Pulmonary effort is normal.  Musculoskeletal:        General: Normal range of motion.  Skin:    General: Skin is warm.  Neurological:     Mental Status: He is alert.     Neurovascular status intact muscle strength was found to be adequate range of motion within normal limits.  Patient is noted to have a dark area of the left hallux most of the medial side of the nailbed that extends down to the base and along the entire side of the nail.  There is no drainage associated with it or skin pathology around the area     Assessment:  Probability that this is trauma or possible fungus but cannot rule out any possibility of other pathology that could associate with nail disease     Plan:  H&P condition reviewed explained and I recommended biopsy of the nailbed.  Patient wants this done and I explained the procedure to the patient and today I infiltrated 60 mg like Marcaine mixture I removed the medial border and I did place it in a sterile vessel for transfer and it will be sent off for evaluation of pathology.  Sterile dressing applied instructed on soaks and will contact patient when we get results

## 2019-04-28 ENCOUNTER — Telehealth: Payer: Self-pay | Admitting: *Deleted

## 2019-04-28 NOTE — Telephone Encounter (Signed)
Returned patient's phone call and left a voicemail message at [phone] (cell #) to let them know that I have not seen their results yet for their nail culture.  It make take several weeks to come in.

## 2019-06-07 ENCOUNTER — Other Ambulatory Visit: Payer: Self-pay | Admitting: Internal Medicine

## 2019-06-18 ENCOUNTER — Other Ambulatory Visit: Payer: Self-pay | Admitting: Internal Medicine

## 2019-06-18 DIAGNOSIS — I10 Essential (primary) hypertension: Secondary | ICD-10-CM

## 2019-07-22 ENCOUNTER — Encounter: Payer: Self-pay | Admitting: Internal Medicine

## 2019-07-23 ENCOUNTER — Other Ambulatory Visit: Payer: Self-pay

## 2019-07-23 DIAGNOSIS — Z8616 Personal history of COVID-19: Secondary | ICD-10-CM

## 2019-07-24 ENCOUNTER — Encounter: Payer: Self-pay | Admitting: Podiatry

## 2019-07-29 ENCOUNTER — Other Ambulatory Visit: Payer: Self-pay

## 2019-07-29 ENCOUNTER — Other Ambulatory Visit (INDEPENDENT_AMBULATORY_CARE_PROVIDER_SITE_OTHER): Payer: Managed Care, Other (non HMO)

## 2019-07-29 DIAGNOSIS — Z8616 Personal history of COVID-19: Secondary | ICD-10-CM

## 2019-07-30 LAB — SARS-COV-2 IGG: SARS-COV-2 IgG: 1.66

## 2019-08-04 ENCOUNTER — Ambulatory Visit: Payer: Managed Care, Other (non HMO) | Attending: Internal Medicine

## 2019-08-04 DIAGNOSIS — Z23 Encounter for immunization: Secondary | ICD-10-CM

## 2019-08-04 NOTE — Progress Notes (Signed)
   Covid-19 Vaccination Clinic  Name:  Colin Watson    MRN: [id number] DOB: 1960-12-30  08/04/2019  Mr. Shahan was observed post Covid-19 immunization for 15 minutes without incident. He was provided with Vaccine Information Sheet and instruction to access the V-Safe system.   Mr. Fjerstad was instructed to call 911 with any severe reactions post vaccine: Marland Kitchen Difficulty breathing  . Swelling of face and throat  . A fast heartbeat  . A bad rash all over body  . Dizziness and weakness   Immunizations Administered    Name Date Dose VIS Date Route   JANSSEN COVID-19 VACCINE 08/04/2019  4:18 PM 0.5 mL 05/08/2019 Intramuscular   Manufacturer: [employer]   Lot: :[id number]   [place]: [id number]

## 2019-08-20 ENCOUNTER — Ambulatory Visit: Payer: Managed Care, Other (non HMO) | Admitting: Internal Medicine

## 2019-09-14 ENCOUNTER — Encounter: Payer: Self-pay | Admitting: Internal Medicine

## 2019-09-14 DIAGNOSIS — I10 Essential (primary) hypertension: Secondary | ICD-10-CM

## 2019-09-15 MED ORDER — LOSARTAN POTASSIUM 100 MG PO TABS: 100.0000 mg | ORAL_TABLET | Freq: Every day | ORAL | 0 refills | Status: DC

## 2019-09-15 MED ORDER — AMLODIPINE BESYLATE 10 MG PO TABS: 10.0000 mg | ORAL_TABLET | Freq: Every day | ORAL | 0 refills | Status: DC

## 2019-10-01 ENCOUNTER — Encounter: Payer: No Typology Code available for payment source | Admitting: Internal Medicine

## 2019-10-12 ENCOUNTER — Encounter: Payer: Managed Care, Other (non HMO) | Admitting: Internal Medicine

## 2019-11-17 ENCOUNTER — Other Ambulatory Visit: Payer: Self-pay | Admitting: Internal Medicine

## 2019-11-30 ENCOUNTER — Other Ambulatory Visit: Payer: Self-pay

## 2019-11-30 ENCOUNTER — Encounter: Payer: Self-pay | Admitting: Internal Medicine

## 2019-11-30 ENCOUNTER — Ambulatory Visit: Payer: Managed Care, Other (non HMO) | Admitting: Internal Medicine

## 2019-11-30 VITALS — BP 143/87 | HR 77 | Temp 98.1°F | Resp 18 | Ht 71.0 in | Wt 242.0 lb

## 2019-11-30 DIAGNOSIS — E785 Hyperlipidemia, unspecified: Secondary | ICD-10-CM

## 2019-11-30 DIAGNOSIS — I10 Essential (primary) hypertension: Secondary | ICD-10-CM | POA: Diagnosis not present

## 2019-11-30 DIAGNOSIS — Z0189 Encounter for other specified special examinations: Secondary | ICD-10-CM

## 2019-11-30 DIAGNOSIS — F17200 Nicotine dependence, unspecified, uncomplicated: Secondary | ICD-10-CM

## 2019-11-30 DIAGNOSIS — Z0000 Encounter for general adult medical examination without abnormal findings: Secondary | ICD-10-CM

## 2019-11-30 DIAGNOSIS — Z122 Encounter for screening for malignant neoplasm of respiratory organs: Secondary | ICD-10-CM

## 2019-11-30 DIAGNOSIS — K219 Gastro-esophageal reflux disease without esophagitis: Secondary | ICD-10-CM

## 2019-11-30 MED ORDER — LOSARTAN POTASSIUM-HCTZ 100-12.5 MG PO TABS
1.0000 | ORAL_TABLET | Freq: Every day | ORAL | 1 refills | Status: DC
Start: 2019-11-30 — End: 2019-12-17

## 2019-11-30 NOTE — Progress Notes (Signed)
Pre visit review using our clinic review tool, if applicable. No additional management support is needed unless otherwise documented below in the visit note. 

## 2019-11-30 NOTE — Progress Notes (Signed)
Subjective:    Patient ID: Colin Watson, male    DOB: 02/11/1961, 59 y.o.   MRN: [id number]  DOS:  11/30/2019 Type of visit - description: CPX In addition to CPX, multiple issues were discussed today. OSA? GERD: Stop PPIs, concerned about long-term side effects, currently having heartburn at nighttime only, on Tums, Pepcid?   Review of Systems  Other than above, a 14 point review of systems is negative     Past Medical History:  Diagnosis Date  . Erectile dysfunction   . GERD (gastroesophageal reflux disease)   . Hypertension 09/23/2013  . 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 Nasal polyp     Past Surgical History:  Procedure Laterality Date  . cyst excision,wrist      Allergies as of 11/30/2019      Reactions   Ampicillin       Medication List       Accurate as of November 30, 2019 11:59 PM. If you have any questions, ask your nurse or doctor.        STOP taking these medications   Chantix Starting Month Pak 0.5 MG X 11 & 1 MG X 42 tablet Generic drug: varenicline Stopped by: Kathlene November, MD   losartan 100 MG tablet Commonly known as: COZAAR Stopped by: Kathlene November, MD   varenicline 1 MG tablet Commonly known as: CHANTIX Stopped by: Kathlene November, MD     TAKE these medications   amLODipine 10 MG tablet Commonly known as: NORVASC Take 1 tablet (10 mg total) by mouth daily.   aspirin EC 81 MG tablet Take 81 mg by mouth daily.   atorvastatin 20 MG tablet Commonly known as: LIPITOR Take 1 tablet (20 mg total) by mouth at bedtime.   carvedilol 25 MG tablet Commonly known as: COREG Take 1 tablet (25 mg total) by mouth 2 (two) times daily with a meal.   fluticasone 50 MCG/ACT nasal spray Commonly known as: FLONASE Place 2 sprays into both nostrils daily.   losartan-hydrochlorothiazide 100-12.5 MG tablet Commonly known as: HYZAAR Take 1 tablet by mouth daily. Started by: Kathlene November, MD    tadalafil 20 MG tablet Commonly known as: CIALIS Take 1 tablet (20 mg total) by mouth every other day as needed for erectile dysfunction.          Objective:   Physical Exam BP (!) 143/87 (BP Location: Left Arm, Patient Position: Sitting, Cuff Size: Normal)   Pulse 77   Temp 98.1 F (36.7 C) (Oral)   Resp 18   Ht 5\' 11"  (1.803 m)   Wt 242 lb (109.8 kg)   SpO2 98%   BMI 33.75 kg/m  General: Well developed, NAD, BMI noted Neck: No  thyromegaly  HEENT:  Normocephalic . Face symmetric, atraumatic Lungs:  CTA B Normal respiratory effort, no intercostal retractions, no accessory muscle use. Heart: RRR,  no murmur.  Abdomen:  Not distended, soft, non-tender. No rebound or rigidity. DRE: Normal sphincter tone, no stools, prostate normal Lower extremities: no pretibial edema bilaterally  Skin: Exposed areas without rash. Not pale. Not jaundice Neurologic:  alert & oriented X3.  Speech normal, gait appropriate for age and unassisted Strength symmetric and appropriate for age.  Psych: Cognition and judgment appear intact.  Cooperative with normal attention span and concentration.  Behavior appropriate. No anxious or depressed appearing.     Assessment  Assessment  HTN Hyperlipidemia:  GERD COPD per CT 2019 ED Nasal polyp H/o meningioma, R optic never sheet, XRT, f/u @ [facility] Sees DERM: BCC, pre-cancer Nevus  PLAN Here for CPX HTN: On amlodipine, carvedilol, losartan 100 mg.  Diastolic BP close to the 54Y, recommend to switch to Hyzaar 100/12.5 mg 1 tablet daily.  Monitor BPs, labs in 10 days Hyperlipidemia: On Lipitor, labs GERD: Stopped PPIs, concerned about potential long-term effects, still having symptoms at night, denies dysphagia or odynophagia.  Recommend nonpharmacological measures, see AVS, Pepcid at night. COPD: Essentially asymptomatic, still smoking. Left great toenail lesion: Saw podiatry, bx done OSA?:  Long history of heavy snoring, witnessed  apnea by wife, refer to neuro, r/o OSA. RTC labs 10 days RTC checkup 4 months  In addition to CPX, I assessed his chronic medical problems as well as a new one (snoring)  This visit occurred during the SARS-CoV-2 public health emergency.  Safety protocols were in place, including screening questions prior to the visit, additional usage of staff PPE, and extensive cleaning of exam room while observing appropriate contact time as indicated for disinfecting solutions.

## 2019-11-30 NOTE — Patient Instructions (Addendum)
Room for improvement on your blood pressure Change losartan to losartan  HCT: 100-12.5 mg 1 tablet daily. Other medications the same  Check the  blood pressure 2 or 3 times a week BP GOAL is between 110/65 and  135/85. If it is consistently higher or lower, let me know  Take OTC Pepcid 20 mg: 1 or 2 tablets before bedtime Watch your diet closely  GO TO THE FRONT DESK, PLEASE SCHEDULE YOUR APPOINTMENTS Come back for in 10 days to 2 weeks, fasting  Come back for checkup in 4 months   Food Choices for Gastroesophageal Reflux Disease, Adult When you have gastroesophageal reflux disease (GERD), the foods you eat and your eating habits are very important. Choosing the right foods can help ease the discomfort of GERD. Consider working with a diet and nutrition specialist (dietitian) to help you make healthy food choices. What general guidelines should I follow?  Eating plan  Choose healthy foods low in fat, such as fruits, vegetables, whole grains, low-fat dairy products, and lean meat, fish, and poultry.  Eat frequent, small meals instead of three large meals each day. Eat your meals slowly, in a relaxed setting. Avoid bending over or lying down until 2-3 hours after eating.  Limit high-fat foods such as fatty meats or fried foods.  Limit your intake of oils, butter, and shortening to less than 8 teaspoons each day.  Avoid the following: ? Foods that cause symptoms. These may be different for different people. Keep a food diary to keep track of foods that cause symptoms. ? Alcohol. ? Drinking large amounts of liquid with meals. ? Eating meals during the 2-3 hours before bed.  Cook foods using methods other than frying. This may include baking, grilling, or broiling. Lifestyle  Maintain a healthy weight. Ask your health care provider what weight is healthy for you. If you need to lose weight, work with your health care provider to do so safely.  Exercise for at least 30 minutes on  5 or more days each week, or as told by your health care provider.  Avoid wearing clothes that fit tightly around your waist and chest.  Do not use any products that contain nicotine or tobacco, such as cigarettes and e-cigarettes. If you need help quitting, ask your health care provider.  Sleep with the head of your bed raised. Use a wedge under the mattress or blocks under the bed frame to raise the head of the bed. What foods are not recommended? The items listed may not be a complete list. Talk with your dietitian about what dietary choices are best for you. Grains Pastries or quick breads with added fat. [place] toast. Vegetables Deep fried vegetables. [place] fries. Any vegetables prepared with added fat. Any vegetables that cause symptoms. For some people this may include tomatoes and tomato products, chili peppers, onions and garlic, and horseradish. Fruits Any fruits prepared with added fat. Any fruits that cause symptoms. For some people this may include citrus fruits, such as oranges, grapefruit, pineapple, and lemons. Meats and other protein foods High-fat meats, such as fatty beef or pork, hot dogs, ribs, ham, sausage, salami and bacon. Fried meat or protein, including fried fish and fried chicken. Nuts and nut butters. Dairy Whole milk and chocolate milk. Sour cream. Cream. Ice cream. Cream cheese. Milk shakes. Beverages Coffee and tea, with or without caffeine. Carbonated beverages. Sodas. Energy drinks. Fruit juice made with acidic fruits (such as orange or grapefruit). Tomato juice. Alcoholic drinks. Fats and  oils Butter. Margarine. Shortening. Ghee. Sweets and desserts Chocolate and cocoa. Donuts. Seasoning and other foods Pepper. Peppermint and spearmint. Any condiments, herbs, or seasonings that cause symptoms. For some people, this may include curry, hot sauce, or vinegar-based salad dressings. Summary  When you have gastroesophageal reflux disease (GERD), food and  lifestyle choices are very important to help ease the discomfort of GERD.  Eat frequent, small meals instead of three large meals each day. Eat your meals slowly, in a relaxed setting. Avoid bending over or lying down until 2-3 hours after eating.  Limit high-fat foods such as fatty meat or fried foods. This information is not intended to replace advice given to you by your health care provider. Make sure you discuss any questions you have with your health care provider. Document Revised: 06/18/2018 Document Reviewed: 02/27/2016 Elsevier Patient Education  [place].

## 2019-12-02 ENCOUNTER — Encounter: Payer: Self-pay | Admitting: Internal Medicine

## 2019-12-02 NOTE — Assessment & Plan Note (Signed)
Here for CPX HTN: On amlodipine, carvedilol, losartan 100 mg.  Diastolic BP close to the 17T, recommend to switch to Hyzaar 100/12.5 mg 1 tablet daily.  Monitor BPs, labs in 10 days Hyperlipidemia: On Lipitor, labs GERD: Stopped PPIs, concerned about potential long-term effects, still having symptoms at night, denies dysphagia or odynophagia.  Recommend nonpharmacological measures, see AVS, Pepcid at night. COPD: Essentially asymptomatic, still smoking. Left great toenail lesion: Saw podiatry, bx done OSA?:  Long history of heavy snoring, witnessed apnea by wife, refer to neuro, r/o OSA. RTC labs 10 days RTC checkup 4 months

## 2019-12-02 NOTE — Assessment & Plan Note (Signed)
-  Td  09/2016 - shingrex d/w pt before - s/p covid shot  (JJ) -  flu shot : plans to do at work -Prostate cancer screening: No FH, DRE today wnl, check PSA -CCS: Cscope 04-2016, polyps, 5 years -Lung cancer screening: Order a CT -Labs, will come back fasting: CMP, FLP, CBC, A1c, PSA -Diet and exercise discussed  -Tobacco: Still smoking, got a prescription for Chantix but is not ready to quit just yet.

## 2019-12-07 ENCOUNTER — Encounter: Payer: Self-pay | Admitting: Neurology

## 2019-12-07 ENCOUNTER — Ambulatory Visit: Payer: Managed Care, Other (non HMO) | Admitting: Neurology

## 2019-12-07 VITALS — BP 126/84 | HR 75 | Ht 72.0 in | Wt 239.0 lb

## 2019-12-07 DIAGNOSIS — E669 Obesity, unspecified: Secondary | ICD-10-CM

## 2019-12-07 DIAGNOSIS — G4719 Other hypersomnia: Secondary | ICD-10-CM

## 2019-12-07 DIAGNOSIS — R351 Nocturia: Secondary | ICD-10-CM

## 2019-12-07 DIAGNOSIS — R0681 Apnea, not elsewhere classified: Secondary | ICD-10-CM

## 2019-12-07 DIAGNOSIS — R0683 Snoring: Secondary | ICD-10-CM | POA: Diagnosis not present

## 2019-12-07 DIAGNOSIS — Z820 Family history of epilepsy and other diseases of the nervous system: Secondary | ICD-10-CM

## 2019-12-07 NOTE — Progress Notes (Signed)
Subjective:    Patient ID: Colin Watson is a 59 y.o. male.  HPI     Star Age, MD, PhD [facility] [address], [address] [address] [place], [place] [address]  Dear Dr. Larose Kells,   I saw your patient, Colin Watson, upon your kind Request, in my sleep clinic today for initial consultation of his sleep disorder, in particular, concern for underlying obstructive sleep apnea.  The patient is unaccompanied today.  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 I reviewed your office note from 11/30/2019.  His Epworth sleepiness score is 16/24, fatigue severity score is 26 out of 63.  He has nocturia about 2 or 3 times per average night, does not have any recurrent morning headaches.  He denies any gasping sensations but has had pauses in his breathing per wife's report and snoring can be loud.  His brother has sleep apnea.  The patient is a 59 side sleeper and back sleeper.  He goes to bed between 1030 and 11 and rise time is between 630 and 7 AM.  He works as a Chemical engineer.  He lives with his wife, they have 2 grown children, 1 son and 1 daughter.  They have a 61 year old granddaughter.  They have no pets in the house, he does have a TV on at night in the bedroom and puts it on a 1 hour time.  He drinks caffeine in the form of coffee, 6 cups/day, smokes about a pack per day and is planning to quit smoking.  He has had a little bit of weight gain in the past 2 years, in the realm of 5 to 10 pounds.  His Past Medical History Is Significant For: Past Medical History:  Diagnosis Date  . Erectile dysfunction   . GERD (gastroesophageal reflux disease)   . Hypertension 09/23/2013  . Meningioma of optic  nerve sheath (HCC)    loss of vision right eye. s/p XRT, f/u at [facility]    . Nasal polyp     His Past Surgical History Is Significant For: Past Surgical History:  Procedure Laterality Date  . cyst excision,wrist      His Family History Is Significant For: Family History  Problem Relation Age of Onset  . COPD Father   . Bone cancer Paternal Grandmother   . Diabetes Neg Hx   . Heart disease Neg Hx   . Colon cancer Neg Hx   . Prostate cancer Neg Hx     His Social History Is Significant For: Social History   Socioeconomic History  . Marital status: Married    Spouse name: Not on file  . Number of children: 2  . Years of education: 66  . Highest education level: Not on file  Occupational History  . Occupation: Optometrist for a local Co  Tobacco Use  . Smoking status: Current Every Day Smoker    Packs/day: 1.00    Years: 33.00    Pack years: 33.00    Types: Cigarettes  . Smokeless tobacco: Current User  . Tobacco comment: ~1 ppd   Substance and Sexual Activity  . Alcohol use: Yes    Alcohol/week: 6.0 standard drinks    Types: 6 Cans of beer per week  . Drug use: No  .  Sexual activity: Yes    Partners: Female  Other Topics Concern  . Not on file  Social History Narrative   [employer], [employer]. Married '82. 61 daughter, 1 son. Work - Optometrist    Marriage in good health   Social Determinants of Radio broadcast assistant Strain:   . Difficulty of Paying Living Expenses: Not on file  Food Insecurity:   . Worried About Charity fundraiser in the Last Year: Not on file  . Ran Out of Food in the Last Year: Not on file  Transportation Needs:   . Lack of Transportation (Medical): Not on file  . Lack of Transportation (Non-Medical): Not on file  Physical Activity:   . Days of Exercise per Week: Not on file  . Minutes of Exercise per Session: Not on file  Stress:   . Feeling of Stress : Not on file  Social Connections:   . Frequency of  Communication with Friends and Family: Not on file  . Frequency of Social Gatherings with Friends and Family: Not on file  . Attends Religious Services: Not on file  . Active Member of Clubs or Organizations: Not on file  . Attends Archivist Meetings: Not on file  . Marital Status: Not on file    His Allergies Are:  Allergies  Allergen Reactions  . Ampicillin   :   His Current Medications Are:  Outpatient Encounter Medications as of 12/07/2019  Medication Sig  . amLODipine (NORVASC) 10 MG tablet Take 1 tablet (10 mg total) by mouth daily.  Marland Kitchen aspirin EC 81 MG tablet Take 81 mg by mouth daily.  Marland Kitchen atorvastatin (LIPITOR) 20 MG tablet Take 1 tablet (20 mg total) by mouth at bedtime.  . carvedilol (COREG) 25 MG tablet Take 1 tablet (25 mg total) by mouth 2 (two) times daily with a meal.  . fluticasone (FLONASE) 50 MCG/ACT nasal spray Place 2 sprays into both nostrils daily.  Marland Kitchen losartan-hydrochlorothiazide (HYZAAR) 100-12.5 MG tablet Take 1 tablet by mouth daily.  . tadalafil (CIALIS) 20 MG tablet Take 1 tablet (20 mg total) by mouth every other day as needed for erectile dysfunction.   No facility-administered encounter medications on file as of 12/07/2019.  :  Review of Systems:  Out of a complete 14 point review of systems, all are reviewed and negative with the exception of these symptoms as listed below: Review of Systems  Neurological:       Here for sleep consult no prior sleep study reports snoring is present.  Epworth Sleepiness Scale 0= would never doze 1= slight chance of dozing 2= moderate chance of dozing 3= high chance of dozing  Sitting and reading:2 Watching TV:2 Sitting inactive in a public place (ex. Theater or meeting):2 As a passenger in a car for an hour without a break:3 Lying down to rest in the afternoon:3 Sitting and talking to someone:1 Sitting quietly after lunch (no alcohol):2 In a car, while stopped in traffic:1 Total:16       Objective:  Neurological Exam  Physical Exam 61/28/21 1555  BP: 126/84  Pulse: 75  SpO2: 95%   General Examination: The patient is a very pleasant 59 y.o. male in no acute distress. He appears well-developed and well-nourished and well groomed.   HEENT: Normocephalic, atraumatic, tracking well preserved, hearing is grossly intact. Face is symmetric with normal facial animation. Speech is clear with no dysarthria noted. There is  no hypophonia. There is no lip, neck/head, jaw or voice tremor. Neck is supple with full range of passive and active motion. There are no carotid bruits on auscultation. Oropharynx exam reveals: mild mouth dryness, adequate dental hygiene and moderate airway crowding, due to larger uvula, smaller airway entry, tonsils of 1-2+, Mallampati is class II. Tongue protrudes centrally and palate elevates symmetrically. Neck size is  17.75 inches. He has a minimal to no overbite.   Chest: Clear to auscultation without wheezing, rhonchi or crackles noted.  Heart: S1+S2+0, regular and normal without murmurs, rubs or gallops noted.   Abdomen: Soft, non-tender and non-distended with normal bowel sounds appreciated on auscultation.  Extremities: There is no pitting edema in the distal lower extremities bilaterally.   Skin: Warm and dry without trophic changes noted.   Musculoskeletal: exam reveals no obvious joint deformities, tenderness or joint swelling or erythema. Toes point out when he stands.  Neurologically:  Mental status: The patient is awake, alert and oriented in all 4 spheres. His immediate and remote memory, attention, language skills and fund of knowledge are appropriate. There is no evidence of aphasia, agnosia, apraxia or anomia. Speech is clear with normal prosody and enunciation. Thought process is linear. Mood is normal and affect is normal.  Cranial nerves II - XII are as described above under HEENT exam.  Motor  exam: Normal bulk, strength and tone is noted. There is no tremor, Romberg is negative. Fine motor skills and coordination: grossly intact.  Cerebellar testing: No dysmetria or intention tremor. There is no truncal or gait ataxia.  Sensory exam: intact to light touch in the upper and lower extremities.  Gait, station and balance: He stands easily. No veering to one side is noted. No leaning to one side is noted. Posture is age-appropriate and stance is narrow based. Gait shows normal stride length and normal pace. No problems turning are noted. Tandem walk is difficult for him but doable.                 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 I had a long chat with the patient about my findings and the diagnosis of OSA, its prognosis and treatment options. We talked about medical treatments, surgical interventions and non-pharmacological approaches. I explained in particular the risks and ramifications of untreated moderate to severe OSA, especially with respect to developing cardiovascular disease down the Road, including congestive heart failure, difficult to treat hypertension, cardiac arrhythmias, or stroke. Even type 2 diabetes has, in part, been linked to untreated OSA. Symptoms of untreated OSA include daytime sleepiness, memory problems, mood irritability and mood disorder such as depression and anxiety, lack of energy, as well as recurrent headaches, especially morning headaches. We talked about the importance of smoking cessation and trying to maintain a healthy lifestyle in general, as well as the importance of weight control. We also talked about the importance of good sleep hygiene. I recommended the  following at this time: sleep study.  I explained the sleep test procedure to the patient and also outlined possible surgical and non-surgical treatment options of OSA, including the use of a custom-made dental device (which would require a referral to a specialist dentist or oral surgeon), upper airway surgical  options, such as traditional UPPP or a novel less invasive surgical option in the form of Inspire hypoglossal nerve stimulation (which would involve a referral to an ENT surgeon). I also explained the CPAP treatment option to the patient, who indicated that he would be willing to try CPAP if the need arises. I answered all his questions today and the patient was in agreement. I plan to see him back after the sleep study is completed and encouraged him to call with any interim questions, concerns, problems or updates.   Thank you very much for allowing me to participate in the care of this nice patient. If I can be of any further assistance to you please do not hesitate to call me at [phone].  Sincerely,   Star Age, MD, PhD

## 2019-12-07 NOTE — Patient Instructions (Signed)

## 2019-12-14 ENCOUNTER — Other Ambulatory Visit: Payer: Self-pay

## 2019-12-14 ENCOUNTER — Other Ambulatory Visit (INDEPENDENT_AMBULATORY_CARE_PROVIDER_SITE_OTHER): Payer: Managed Care, Other (non HMO)

## 2019-12-14 ENCOUNTER — Other Ambulatory Visit: Payer: Managed Care, Other (non HMO)

## 2019-12-14 DIAGNOSIS — I10 Essential (primary) hypertension: Secondary | ICD-10-CM

## 2019-12-14 DIAGNOSIS — E785 Hyperlipidemia, unspecified: Secondary | ICD-10-CM

## 2019-12-14 DIAGNOSIS — Z0000 Encounter for general adult medical examination without abnormal findings: Secondary | ICD-10-CM | POA: Diagnosis not present

## 2019-12-15 LAB — CBC WITH DIFFERENTIAL/PLATELET
Absolute Monocytes: 720 cells/uL (ref 200–950)
Basophils Absolute: 56 cells/uL (ref 0–200)
Basophils Relative: 0.7 %
Eosinophils Absolute: 424 cells/uL (ref 15–500)
Eosinophils Relative: 5.3 %
HCT: 39.8 % (ref 38.5–50.0)
Hemoglobin: 13.8 g/dL (ref 13.2–17.1)
Lymphs Abs: 2000 cells/uL (ref 850–3900)
MCH: 31.7 pg (ref 27.0–33.0)
MCHC: 34.7 g/dL (ref 32.0–36.0)
MCV: 91.3 fL (ref 80.0–100.0)
MPV: 9.7 fL (ref 7.5–12.5)
Monocytes Relative: 9 %
Neutro Abs: 4800 cells/uL (ref 1500–7800)
Neutrophils Relative %: 60 %
Platelets: 329 10*3/uL (ref 140–400)
RBC: 4.36 10*6/uL (ref 4.20–5.80)
RDW: 12.2 % (ref 11.0–15.0)
Total Lymphocyte: 25 %
WBC: 8 10*3/uL (ref 3.8–10.8)

## 2019-12-15 LAB — COMPREHENSIVE METABOLIC PANEL
AG Ratio: 2 (calc) (ref 1.0–2.5)
ALT: 23 U/L (ref 9–46)
AST: 15 U/L (ref 10–35)
Albumin: 4.2 g/dL (ref 3.6–5.1)
Alkaline phosphatase (APISO): 99 U/L (ref 35–144)
BUN: 10 mg/dL (ref 7–25)
CO2: 27 mmol/L (ref 20–32)
Calcium: 9.3 mg/dL (ref 8.6–10.3)
Chloride: 98 mmol/L (ref 98–110)
Creat: 0.75 mg/dL (ref 0.70–1.25)
Globulin: 2.1 g/dL (calc) (ref 1.9–3.7)
Glucose, Bld: 114 mg/dL — ABNORMAL HIGH (ref 65–99)
Potassium: 5.3 mmol/L (ref 3.5–5.3)
Sodium: 134 mmol/L — ABNORMAL LOW (ref 135–146)
Total Bilirubin: 0.7 mg/dL (ref 0.2–1.2)
Total Protein: 6.3 g/dL (ref 6.1–8.1)

## 2019-12-15 LAB — LIPID PANEL
Cholesterol: 162 mg/dL (ref <200)
HDL: 62 mg/dL (ref >=40)
LDL Cholesterol (Calc): 86 mg/dL (calc)
Non-HDL Cholesterol (Calc): 100 mg/dL (calc) (ref <130)
Total CHOL/HDL Ratio: 2.6 (calc) (ref <5.0)
Triglycerides: 58 mg/dL (ref <150)

## 2019-12-15 LAB — HEMOGLOBIN A1C
Hgb A1c MFr Bld: 5.4 % of total Hgb (ref <5.7)
Mean Plasma Glucose: 108 (calc)
eAG (mmol/L): 6 (calc)

## 2019-12-15 LAB — PSA: PSA: 0.4 ng/mL (ref <=4.0)

## 2019-12-17 MED ORDER — ATORVASTATIN CALCIUM 20 MG PO TABS
20.0000 mg | ORAL_TABLET | Freq: Every day | ORAL | 1 refills | Status: DC
Start: 2019-12-17 — End: 2019-12-27

## 2019-12-17 MED ORDER — CARVEDILOL 25 MG PO TABS: 25.0000 mg | ORAL_TABLET | Freq: Two times a day (BID) | ORAL | 1 refills | Status: DC

## 2019-12-17 MED ORDER — AMLODIPINE BESYLATE 10 MG PO TABS: 10.0000 mg | ORAL_TABLET | Freq: Every day | ORAL | 1 refills | Status: DC

## 2019-12-17 MED ORDER — LOSARTAN POTASSIUM-HCTZ 100-12.5 MG PO TABS: 1.0000 | ORAL_TABLET | Freq: Every day | ORAL | 1 refills | Status: DC

## 2019-12-17 NOTE — Addendum Note (Signed)
Addended byDamita Dunnings D on: 12/17/2019 11:49 AM   Modules accepted: Orders

## 2019-12-23 ENCOUNTER — Other Ambulatory Visit: Payer: Self-pay | Admitting: Internal Medicine

## 2019-12-23 ENCOUNTER — Telehealth: Payer: Self-pay

## 2019-12-23 DIAGNOSIS — I10 Essential (primary) hypertension: Secondary | ICD-10-CM

## 2019-12-23 NOTE — Telephone Encounter (Signed)
LVM to schedule HST °

## 2019-12-26 ENCOUNTER — Other Ambulatory Visit: Payer: Self-pay | Admitting: Internal Medicine

## 2019-12-29 ENCOUNTER — Other Ambulatory Visit: Payer: Self-pay

## 2019-12-29 ENCOUNTER — Ambulatory Visit
Admission: RE | Admit: 2019-12-29 | Discharge: 2019-12-29 | Disposition: A | Discharge status: Home / Self Care | Payer: Managed Care, Other (non HMO) | Source: Ambulatory Visit | Attending: Internal Medicine | Admitting: Internal Medicine

## 2020-01-12 ENCOUNTER — Ambulatory Visit (INDEPENDENT_AMBULATORY_CARE_PROVIDER_SITE_OTHER): Payer: Managed Care, Other (non HMO) | Admitting: Neurology

## 2020-01-12 DIAGNOSIS — G4733 Obstructive sleep apnea (adult) (pediatric): Secondary | ICD-10-CM | POA: Diagnosis not present

## 2020-01-12 DIAGNOSIS — G4719 Other hypersomnia: Secondary | ICD-10-CM

## 2020-01-12 DIAGNOSIS — R0683 Snoring: Secondary | ICD-10-CM

## 2020-01-12 DIAGNOSIS — E669 Obesity, unspecified: Secondary | ICD-10-CM

## 2020-01-12 DIAGNOSIS — Z820 Family history of epilepsy and other diseases of the nervous system: Secondary | ICD-10-CM

## 2020-01-12 DIAGNOSIS — R351 Nocturia: Secondary | ICD-10-CM

## 2020-01-12 DIAGNOSIS — R0681 Apnea, not elsewhere classified: Secondary | ICD-10-CM

## 2020-01-19 ENCOUNTER — Other Ambulatory Visit: Payer: Self-pay

## 2020-01-19 ENCOUNTER — Encounter: Payer: Self-pay | Admitting: Neurology

## 2020-01-19 ENCOUNTER — Emergency Department
Admission: RE | Admit: 2020-01-19 | Discharge: 2020-01-19 | Disposition: A | Discharge status: Home / Self Care | Payer: Managed Care, Other (non HMO) | Source: Ambulatory Visit | Attending: Family Medicine | Admitting: Family Medicine

## 2020-01-19 VITALS — BP 151/79 | HR 84 | Temp 98.4°F | Resp 16 | Ht 72.0 in | Wt 235.0 lb

## 2020-01-19 DIAGNOSIS — J069 Acute upper respiratory infection, unspecified: Secondary | ICD-10-CM

## 2020-01-19 DIAGNOSIS — J9801 Acute bronchospasm: Secondary | ICD-10-CM

## 2020-01-19 DIAGNOSIS — R059 Cough, unspecified: Secondary | ICD-10-CM | POA: Diagnosis not present

## 2020-01-19 MED ORDER — PREDNISONE 20 MG PO TABS: ORAL_TABLET | ORAL | 0 refills | Status: AC

## 2020-01-19 MED ORDER — DOXYCYCLINE HYCLATE 100 MG PO CAPS: ORAL_CAPSULE | ORAL | 0 refills | Status: AC

## 2020-01-19 MED ORDER — METHYLPREDNISOLONE SODIUM SUCC 125 MG IJ SOLR
80.0000 mg | Freq: Once | INTRAMUSCULAR | Status: AC
  Administered 2020-01-19: 80 mg via INTRAMUSCULAR

## 2020-01-19 NOTE — Discharge Instructions (Addendum)
Begin prednisone Thursday 01/20/20. May continue Mucinex-D, one tab twice daily.  May add Mucinex (plain) 600mg , one tab twice daily.  Take with plenty of water. May take Delsym Cough Suppressant ("12 Hour Cough Relief") at bedtime for nighttime cough.   May use Afrin nasal spray (or generic oxymetazoline) each morning for about 5 days and then discontinue.  Also recommend using saline nasal spray several times daily and saline nasal irrigation (AYR is a common brand).  Use Flonase nasal spray each morning after using Afrin nasal spray and saline nasal irrigation. Try warm salt water gargles for sore throat.  Stop all antihistamines (Nyquil, etc) for now, and other non-prescription cough/cold preparations. Begin Doxycycline if not improving about one week or if persistent fever develops

## 2020-01-19 NOTE — ED Provider Notes (Signed)
[facility]    CSN: [id number] Arrival date & time: 01/19/20  0354      History   Chief Complaint Chief Complaint  Patient presents with  . Cough    10AM appt    HPI Colin Watson is a 59 y.o. male.   Patient began sneezing about 5 to 7 days ago.  Four days ago he developed chest congestion followed by a non-productive cough, sinus congestion, mild wheezing, and mild headache.  He denies fevers, chills, and sweats and no pleuritic pain. He has a history of seasonal rhinitis, and past history of pneumonia.  The history is provided by the patient.    Past Medical History:  Diagnosis Date  . Erectile dysfunction   . GERD (gastroesophageal reflux disease)   . Hypertension 09/23/2013  . 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 Nasal polyp     Patient Active Problem List   Diagnosis Date Noted  . PCP NOTES >>>>> 02/22/2015  . Tobacco abuse 11/29/2013  . GERD (gastroesophageal reflux disease) 09/23/2013  . Hypertension 09/23/2013  . Annual physical exam 12/03/2011  . ED (erectile dysfunction) 12/03/2011  . MENINGIOMA 09/01/2008  . NASAL POLYP 09/01/2008    Past Surgical History:  Procedure Laterality Date  . cyst excision,wrist         Home Medications    Prior to Admission medications   Medication Sig Start Date End Date Taking? Authorizing Provider  amLODipine (NORVASC) 10 MG tablet Take 1 tablet (10 mg total) by mouth daily. 12/17/19  Yes Paz, Alda Berthold, MD  atorvastatin (LIPITOR) 20 MG tablet Take 1 tablet (20 mg total) by mouth at bedtime. 12/27/19  Yes Paz, Alda Berthold, MD  carvedilol (COREG) 25 MG tablet Take 1 tablet (25 mg total) by mouth 2 (two) times daily with a meal. 12/23/19  Yes Paz, Alda Berthold, MD  fluticasone [facility]) 50 MCG/ACT nasal spray Place 2 sprays into both nostrils daily. 05/04/18  Yes Paz, Alda Berthold, MD  losartan-hydrochlorothiazide (HYZAAR) 100-12.5 MG tablet Take 1 tablet by  mouth daily. 12/17/19  Yes Paz, Alda Berthold, MD  tadalafil (CIALIS) 20 MG tablet Take 1 tablet (20 mg total) by mouth every other day as needed for erectile dysfunction. 12/18/18  Yes Paz, Alda Berthold, MD  doxycycline (VIBRAMYCIN) 100 MG capsule Take one cap PO Q12hr with food. 01/19/20 02/09/20  Kandra Nicolas, MD  predniSONE (DELTASONE) 20 MG tablet Take one tab by mouth twice daily for 4 days, then one daily for 3 days. Take with food. 01/19/20 01/29/20  Kandra Nicolas, MD    Family History Family History  Problem Relation Age of Onset  . COPD Father   . Bone cancer Paternal Grandmother   . Diabetes Neg Hx   . Heart disease Neg Hx   . Colon cancer Neg Hx   . Prostate cancer Neg Hx     Social History Social History   Tobacco Use  . Smoking status: Current Every Day Smoker    Packs/day: 1.00    Years: 33.00    Pack years: 33.00    Types: Cigarettes  . Smokeless tobacco: Current User  . Tobacco comment: ~1 ppd   Vaping Use  . Vaping Use: Never used  Substance Use Topics  . Alcohol use: Yes    Alcohol/week: 6.0 standard drinks    Types: 6 Cans of beer per week  .  Drug use: No     Allergies   Ampicillin   Review of Systems Review of Systems  No sore throat + cough No pleuritic pain + wheezing + nasal congestion + sneezing ? post-nasal drainage No sinus pain/pressure No itchy/red eyes No earache No hemoptysis No SOB No fever/chills No nausea No vomiting No abdominal pain No diarrhea No urinary symptoms No skin rash + fatigue No myalgias + headache Used OTC meds (Nyquil, Mucinex D) without relief    Physical Exam Triage Vital Signs ED Triage Vitals  Enc Vitals Group     BP 01/19/20 1012 (!) 151/79     Pulse Rate 01/19/20 1012 84     Resp 01/19/20 1012 16     Temp 01/19/20 1012 98.4 F (36.9 C)     Temp Source 01/19/20 1012 Oral     SpO2 01/19/20 1012 96 %     Weight 01/19/20 1010 235 lb (106.6 kg)     Height 01/19/20 1010 6' (1.829 m)     Head  Circumference --      Peak Flow --      Pain Score 01/19/20 1010 0     Pain Loc --      Pain Edu? --      Excl. in [place]? --    No data found.  Updated Vital Signs BP (!) 151/79 (BP Location: Right Arm)   Pulse 84   Temp 98.4 F (36.9 C) (Oral)   Resp 16   Ht 6' (1.829 m)   Wt 106.6 kg   SpO2 96%   BMI 31.87 kg/m   Visual Acuity Right Eye Distance:   Left Eye Distance:   Bilateral Distance:    Right Eye Near:   Left Eye Near:    Bilateral Near:     Physical Exam Nursing notes and Vital Signs reviewed. Appearance:  Patient appears stated age, and in no acute distress Eyes:  Pupils are equal, round, and reactive to light and accomodation.  Extraocular movement is intact.  Conjunctivae are not inflamed  Ears:  Canals normal.  Tympanic membranes normal.  Nose:  Mildly congested turbinates.  No sinus tenderness. Neck:  Supple.  Enlarged but nontender lateral nodes Lungs:   Bibasilar wheezes present.  Breath sounds are equal.  Moving air well. Heart:  Regular rate and rhythm without murmurs, rubs, or gallops.  Abdomen:  Nontender without masses or hepatosplenomegaly.  Bowel sounds are present.  No CVA or flank tenderness.  Extremities:  No edema.  Skin:  No rash present.   UC Treatments / Results  Labs (all labs ordered are listed, but only abnormal results are displayed) Labs Reviewed  NOVEL CORONAVIRUS, NAA    EKG   Radiology No results found.  Procedures Procedures (including critical care time)  Medications Ordered in UC Medications  methylPREDNISolone sodium succinate (SOLU-MEDROL) 125 mg/2 mL injection 80 mg (80 mg Intramuscular Given 01/19/20 1051)    Initial Impression / Assessment and Plan / UC Course  I have reviewed the triage vital signs and the nursing notes.  Pertinent labs & imaging results that were available during my care of the patient were reviewed by me and considered in my medical decision making (see chart for details).    There is  no evidence of bacterial infection today.   Administered Solumedrol 80mg  IM; then begin prednisone burst/taper. Followup with Family Doctor if not improved in about 10 days.   Final Clinical Impressions(s) / UC Diagnoses   Final diagnoses:  Cough  Viral URI with cough  Bronchospasm, acute     Discharge Instructions     Begin prednisone Thursday 01/20/20. May continue Mucinex-D, one tab twice daily.  May add Mucinex (plain) 600mg , one tab twice daily.  Take with plenty of water. May take Delsym Cough Suppressant ("12 Hour Cough Relief") at bedtime for nighttime cough.   May use Afrin nasal spray (or generic oxymetazoline) each morning for about 5 days and then discontinue.  Also recommend using saline nasal spray several times daily and saline nasal irrigation (AYR is a common brand).  Use Flonase nasal spray each morning after using Afrin nasal spray and saline nasal irrigation. Try warm salt water gargles for sore throat.  Stop all antihistamines (Nyquil, etc) for now, and other non-prescription cough/cold preparations. Begin Doxycycline if not improving about one week or if persistent fever develops (Given a prescription to hold, with an expiration date)       ED Prescriptions    Medication Sig Dispense Auth. Provider   predniSONE (DELTASONE) 20 MG tablet Take one tab by mouth twice daily for 4 days, then one daily for 3 days. Take with food. 11 tablet Kandra Nicolas, MD   doxycycline (VIBRAMYCIN) 100 MG capsule Take one cap PO Q12hr with food. 14 capsule Kandra Nicolas, MD        Kandra Nicolas, MD 01/21/20 [phone]

## 2020-01-19 NOTE — ED Triage Notes (Signed)
Pt c/o cough and congestion x 3 days. Taking Mucinex and Nyquil. Denies fever. Reports hx of Covid vaccine. Reports coworker with Covid 2 wks ago, but no known recent exposure.

## 2020-01-20 NOTE — Addendum Note (Signed)
Addended by: Star Age on: 01/20/2020 07:42 AM   Modules accepted: Orders

## 2020-01-20 NOTE — Progress Notes (Signed)
Patient referred by Dr. Larose Kells, seen by me on 12/07/19, HST on 01/12/20.    Please call and notify the patient that the recent home sleep test showed obstructive sleep apnea in the severe range. While I recommend treatment for this in the form CPAP, his insurance will not approve a sleep study for this. They will likely only approve a trial of autoPAP, which means, that we don't have to bring him in for a sleep study with CPAP, but will let him start using a so called autoPAP machine at home, through a DME company (of his choice, or as per insurance requirement). The DME representative will educate him on how to use the machine, how to put the mask on, etc. I have placed an order in the chart. Please send referral, talk to patient, send report to referring MD. We will need a FU in sleep clinic for 10 weeks post-PAP set up, please arrange that with me or one of our NPs. Thanks,   Star Age, MD, PhD [facility])

## 2020-01-20 NOTE — Procedures (Signed)
   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  Epworth sleepiness score: 16/24.  BMI: 32.5 kg/m  FINDINGS:   Total Record Time (hours, min): 7 H 44 min  Total Sleep Time (hours, min):  6 H 20 min   Percent REM (%):    31.84 %   Calculated pAHI (per hour):  46.6       REM pAHI: 64.7        NREM pAHI: 38.5   Oxygen Saturation (%) Mean: 93  Minimum oxygen saturation (%): 84                 O2 Saturation Range (%): 99-84  O2Saturation (minutes) <=88%: 1.3 min  Pulse Mean (bpm):    68  Pulse Range (39-93)   IMPRESSION: OSA (obstructive sleep apnea)  RECOMMENDATION:  This home sleep test demonstrates severe obstructive sleep apnea with a total AHI of 46.6/hour and O2 nadir of 84%. Treatment with positive airway pressure is recommended. The patient will be advised to proceed with an autoPAP titration/trial at home for now. A full night titration study may be considered to optimize treatment settings, if needed down the road.  Alternative treatment options may be limited secondary to the severity of his sleep disordered breathing.  Please note that untreated obstructive sleep apnea may carry additional perioperative morbidity. Patients with significant obstructive sleep apnea should receive perioperative PAP therapy and the surgeons and particularly the anesthesiologist should be informed of the diagnosis and the severity of the sleep disordered breathing. The patient should be cautioned not to drive, work at heights, or operate dangerous or heavy  equipment when tired or sleepy. Review and reiteration of good sleep hygiene measures should be pursued with any patient. Other causes of the patient's symptoms, including circadian rhythm disturbances, an underlying mood disorder, medication effect and/or an underlying medical problem cannot be ruled out based on this test. Clinical correlation is recommended. The patient and his referring provider will be notified of the test results. The patient will be seen in follow up in sleep clinic at [facility].  I certify that I have reviewed the raw data recording prior to the issuance of this report in accordance with the standards of the American Academy of Sleep Medicine (AASM).    INTERPRETING PHYSICIAN:    Star Age, MD, PhD  Board Certified in Neurology and Sleep Medicine  [facility] [address] [place], [place] [address] [phone]

## 2020-01-20 NOTE — Telephone Encounter (Signed)
Sent community message to [employer] that order placed and sent copy of HST to PCP (referring provider)

## 2020-01-21 LAB — SARS-COV-2, NAA 2 DAY TAT

## 2020-01-21 LAB — NOVEL CORONAVIRUS, NAA: SARS-CoV-2, NAA: NOT DETECTED

## 2020-03-29 ENCOUNTER — Ambulatory Visit: Payer: Managed Care, Other (non HMO) | Admitting: Neurology

## 2020-03-31 ENCOUNTER — Ambulatory Visit: Payer: Managed Care, Other (non HMO) | Admitting: Internal Medicine

## 2020-03-31 ENCOUNTER — Other Ambulatory Visit: Payer: Self-pay

## 2020-03-31 ENCOUNTER — Encounter: Payer: Self-pay | Admitting: Internal Medicine

## 2020-03-31 VITALS — BP 148/91 | HR 74 | Temp 97.7°F | Resp 18 | Ht 72.0 in | Wt 242.2 lb

## 2020-03-31 DIAGNOSIS — G4733 Obstructive sleep apnea (adult) (pediatric): Secondary | ICD-10-CM

## 2020-03-31 DIAGNOSIS — Z9989 Dependence on other enabling machines and devices: Secondary | ICD-10-CM | POA: Diagnosis not present

## 2020-03-31 DIAGNOSIS — I10 Essential (primary) hypertension: Secondary | ICD-10-CM

## 2020-03-31 NOTE — Progress Notes (Signed)
Subjective:    Patient ID: Colin Watson, male    DOB: 11/12/1960, 60 y.o.   MRN: [id number]  DOS:  03/31/2020 Type of visit - description: Routine checkup Since the last office visit he was diagnosed with sleep apnea. BP meds were adjusted.  Good compliance.   Review of Systems Recently had a cold but he is much better.  Past Medical History:  Diagnosis Date  . Erectile dysfunction   . GERD (gastroesophageal reflux disease)   . Hypertension 09/23/2013  . 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 Nasal polyp     Past Surgical History:  Procedure Laterality Date  . cyst excision,wrist      Allergies as of 03/31/2020      Reactions   Ampicillin       Medication List       Accurate as of March 31, 2020 11:59 PM. If you have any questions, ask your nurse or doctor.        amLODipine 10 MG tablet Commonly known as: NORVASC Take 1 tablet (10 mg total) by mouth daily.   atorvastatin 20 MG tablet Commonly known as: LIPITOR Take 1 tablet (20 mg total) by mouth at bedtime.   carvedilol 25 MG tablet Commonly known as: COREG Take 1 tablet (25 mg total) by mouth 2 (two) times daily with a meal.   fluticasone 50 MCG/ACT nasal spray Commonly known as: FLONASE Place 2 sprays into both nostrils daily.   losartan-hydrochlorothiazide 100-12.5 MG tablet Commonly known as: HYZAAR Take 1 tablet by mouth daily.   tadalafil 20 MG tablet Commonly known as: CIALIS Take 1 tablet (20 mg total) by mouth every other day as needed for erectile dysfunction.          Objective:   Physical Exam BP (!) 148/91 (BP Location: Left Arm, Patient Position: Sitting, Cuff Size: Normal)   Pulse 74   Temp 97.7 F (36.5 C) (Oral)   Resp 18   Ht 6' (1.829 m)   Wt 242 lb 4 oz (109.9 kg)   SpO2 97%   BMI 32.86 kg/m  General:   Well developed, NAD, BMI noted. HEENT:  Normocephalic . Face symmetric, atraumatic Lungs:  CTA  B Normal respiratory effort, no intercostal retractions, no accessory muscle use. Heart: RRR,  no murmur.  Lower extremities: no pretibial edema bilaterally  Skin: Not pale. Not jaundice Neurologic:  alert & oriented X3.  Speech normal, gait appropriate for age and unassisted Psych--  Cognition and judgment appear intact.  Cooperative with normal attention span and concentration.  Behavior appropriate. No anxious or depressed appearing.      Assessment     Assessment  HTN Hyperlipidemia:  GERD COPD per CT 2019 ED Nasal polyp H/o meningioma, R optic never sheet, XRT, f/u @ [facility] DERM: BCC, pre-cancer Nevus OSA, severe, DX 01/11/2021  PLAN HTN: At the last visit, losartan switched to Hyzaar, subsequent BMP very good.  He also takes amlodipine, carvedilol,BP today slightly elevated but at home is never more than 130s/90s. For now recommend no change because he just started to use her CPAP, anticipate BP will be better in the following weeks. OSA: Recent diagnosis, just got his CPAP 2 weeks ago, is a still having a hard time particularly because he had a cold last week.  A number of tips on how to add up to a CPAP and encouragement provided.  Preventive care: Fully vaccinated for COVID, plans to get a flu shot soon. RTC 11-2020 CPX   This visit occurred during the SARS-CoV-2 public health emergency.  Safety protocols were in place, including screening questions prior to the visit, additional usage of staff PPE, and extensive cleaning of exam room while observing appropriate contact time as indicated for disinfecting solutions.

## 2020-03-31 NOTE — Patient Instructions (Signed)
Check the  blood pressure   BP GOAL is between 110/65 and  135/85. If it is consistently higher or lower, let me know     [place], PLEASE SCHEDULE YOUR APPOINTMENTS Come back for  A physical exam by 11/2020

## 2020-03-31 NOTE — Progress Notes (Signed)
Pre visit review using our clinic review tool, if applicable. No additional management support is needed unless otherwise documented below in the visit note. 

## 2020-04-01 DIAGNOSIS — G4733 Obstructive sleep apnea (adult) (pediatric): Secondary | ICD-10-CM | POA: Insufficient documentation

## 2020-04-01 NOTE — Assessment & Plan Note (Signed)
HTN: At the last visit, losartan switched to Hyzaar, subsequent BMP very good.  He also takes amlodipine, carvedilol,BP today slightly elevated but at home is never more than 130s/90s. For now recommend no change because he just started to use her CPAP, anticipate BP will be better in the following weeks. OSA: Recent diagnosis, just got his CPAP 2 weeks ago, is a still having a hard time particularly because he had a cold last week.  A number of tips on how to add up to a CPAP and encouragement provided. Preventive care: Fully vaccinated for COVID, plans to get a flu shot soon. RTC 11-2020 CPX

## 2020-05-25 ENCOUNTER — Ambulatory Visit: Payer: Managed Care, Other (non HMO) | Admitting: Neurology

## 2020-05-25 ENCOUNTER — Encounter: Payer: Self-pay | Admitting: Neurology

## 2020-05-25 VITALS — BP 144/87 | HR 74 | Ht 71.0 in | Wt 246.0 lb

## 2020-05-25 DIAGNOSIS — Z789 Other specified health status: Secondary | ICD-10-CM

## 2020-05-25 DIAGNOSIS — G4733 Obstructive sleep apnea (adult) (pediatric): Secondary | ICD-10-CM | POA: Diagnosis not present

## 2020-05-25 NOTE — Progress Notes (Signed)
Subjective:    Patient ID: Colin Watson is a 60 y.o. male.  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  The patient is unaccompanied today.  I first met him at the request of his primary care physician on 12/07/2019, at which time he reported snoring and excessive daytime somnolence.  He had witnessed apneas as well.  He was advised to proceed with sleep study.  He had a home sleep test on 01/12/2020 which indicated severe obstructive sleep apnea with an AHI of 46.6/h, O2 nadir 84%. He was advised to start AutoPap therapy.  His set up date was 03/14/2020.  Today, 05/25/2020: I reviewed his AutoPap compliance data for the past 70 days from 03/14/2020 through 05/22/2020, during which time he used his machine only 8 days with percent use days greater than 4 hours at 0%.  Average AHI when he used his machine was 2.7/h, 95th percentile of pressure at 8.6 cm with a range of 7 to 15 cm with EPR, leak on the low side with a 95th percentile at 3 L/min.  He reports that his mask is not conducive to side sleeping.  He is motivated to continue with treatment.  He would like to consider a different mask.  He also feels that the machine ramps up too quickly.  He would like to reduce the ramp time.  He feels that the pressure goes up too high.  He would like to see if we can reduce the pressure settings.  Previously:   12/07/19: (He) reports snoring and excessive daytime somnolence as well as witnessed apneas per wife's report. I reviewed your office note from 11/30/2019.  His Epworth sleepiness score is 16/24, fatigue severity score is 26 out of 63.  He has nocturia about 2 or 3 times per average night,  does not have any recurrent morning headaches.  He denies any gasping sensations but has had pauses in his breathing per wife's report and snoring can be loud.  His brother has sleep apnea.  The patient is a side sleeper and back sleeper.  He goes to bed between 1030 and 11 and rise time is between 630 and 7 AM.  He works as a Chemical engineer.  He lives with his wife, they have 2 grown children, 1 son and 1 daughter.  They have a 88 year old granddaughter.  They have no pets in the house, he does have a TV on at night in the bedroom and puts it on a 1 hour time.  He drinks caffeine in the form of coffee, 6 cups/day, smokes about a pack per day and is planning to quit smoking.  He has had a little bit of weight gain in the past 2 years, in the realm of 5 to 10 pounds.  His Past Medical History Is Significant For: Past Medical History:  Diagnosis Date  . Erectile dysfunction   . GERD (gastroesophageal reflux disease)   . Hypertension 09/23/2013  . 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, f/u at [facility]    . Nasal polyp     His Past Surgical History Is Significant For: Past Surgical History:  Procedure Laterality Date  . cyst  excision,wrist      His Family History Is Significant For: Family History  Problem Relation Age of Onset  . COPD Father   . Bone cancer Paternal Grandmother   . Diabetes Neg Hx   . Heart disease Neg Hx   . Colon cancer Neg Hx   . Prostate cancer Neg Hx     His Social History Is Significant For: Social History   Socioeconomic History  . Marital status: Married    Spouse name: Not on file  . Number of children: 2  . Years of education: 11  . Highest education level: Not on file  Occupational History  . Occupation: Optometrist for a local Co  Tobacco Use  . Smoking status: Current Every Day Smoker    Packs/day: 1.00    Years: 33.00    Pack years: 33.00    Types: Cigarettes  . Smokeless tobacco: Current User  .  Tobacco comment: ~1 ppd   Vaping Use  . Vaping Use: Never used  Substance and Sexual Activity  . Alcohol use: Yes    Alcohol/week: 6.0 standard drinks    Types: 6 Cans of beer per week  . Drug use: No  . Sexual activity: Yes    Partners: Female  Other Topics Concern  . Not on file  Social History Narrative   HS[employer], [employer] [place] [employer]. Married '82. 1 daughter, 1 son. Work - Optometrist    Marriage in good health      Lives at home with wife   Right handed   Caffeine: at least 4 cups/day   Social Determinants of Health   Financial Resource Strain: Not on file  Food Insecurity: Not on file  Transportation Needs: Not on file  Physical Activity: Not on file  Stress: Not on file  Social Connections: Not on file    His Allergies Are:  Allergies  Allergen Reactions  . Ampicillin   :   His Current Medications Are:  Outpatient Encounter Medications as of 05/25/2020  Medication Sig  . amLODipine (NORVASC) 10 MG tablet Take 1 tablet (10 mg total) by mouth daily.  Marland Kitchen atorvastatin (LIPITOR) 20 MG tablet Take 1 tablet (20 mg total) by mouth at bedtime.  . carvedilol (COREG) 25 MG tablet Take 1 tablet (25 mg total) by mouth 2 (two) times daily with a meal.  . fluticasone (FLONASE) 50 MCG/ACT nasal spray Place 2 sprays into both nostrils daily.  Marland Kitchen losartan-hydrochlorothiazide (HYZAAR) 100-12.5 MG tablet Take 1 tablet by mouth daily.  . tadalafil (CIALIS) 20 MG tablet Take 1 tablet (20 mg total) by mouth every other day as needed for erectile dysfunction.   No facility-administered encounter medications on file as of 05/25/2020.  :  Review of Systems:  Out of a complete 14 point review of systems, all are reviewed and negative with the exception of these symptoms as listed below: Usually posted Review of Systems  Neurological:       Patient is here for initial CPAP follow-up. He admits he has struggled with it and feels it needs some adjustments with the mask. He feels he  needs to try one that will allow him to sleep on his side better because he is a side-sleeper. He feels its "ramps up" too much as well although he knows its necessary.     Objective:  Neurological Exam  Physical Exam Physical Examination:   Vitals:   05/25/20 0941  BP: (!) 144/87  Pulse: 74   General Examination:  The patient is a very pleasant 60 y.o. male in no acute distress. He appears well-developed and well-nourished and well groomed.   HEENT: Normocephalic, atraumatic, tracking well preserved, hearing is grossly intact. Face is symmetric with normal facial animation. Speech is clear with no dysarthria noted. There is no hypophonia. There is no lip, neck/head, jaw or voice tremor. Neck is supple with full range of passive and active motion. There are no carotid bruits on auscultation. Oropharynx exam reveals: mild mouth dryness, adequate dental hygiene and moderate airway crowding. Tongue protrudes centrally and palate elevates symmetrically.  Chest: Clear to auscultation without wheezing, rhonchi or crackles noted.  Heart: S1+S2+0, regular and normal without murmurs, rubs or gallops noted.   Abdomen: Soft, non-tender and non-distended with normal bowel sounds appreciated on auscultation.  Extremities: There is no pitting edema in the distal lower extremities bilaterally.   Skin: Warm and dry without trophic changes noted.   Musculoskeletal: exam reveals no obvious joint deformities, tenderness or joint swelling or erythema. Toes point out when he stands.  Neurologically:  Mental status: The patient is awake, alert and oriented in all 4 spheres. His immediate and remote memory, attention, language skills and fund of knowledge are appropriate. There is no evidence of aphasia, agnosia, apraxia or anomia. Speech is clear with normal prosody and enunciation. Thought process is linear. Mood is normal and affect is normal.  Cranial nerves II - XII are as described above under  HEENT exam.  Motor exam: Normal bulk, strength and tone is noted. There is no tremor, Fine motor skills and coordination: grossly intact.  Cerebellar testing: No dysmetria or intention tremor. There is no truncal or gait ataxia.  Sensory exam: intact to light touch in the upper and lower extremities.  Gait, station and balance: He stands easily. No veering to one side is noted. No leaning to one side is noted. Posture is age-appropriate and stance is narrow based. Gait shows normal stride length and normal pace. No problems turning are noted.  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  He was set up with AutoPap therapy at home on 03/14/2020.  He has not been able to be fully compliant with treatment.  He only used his machine in the beginning and had trouble tolerating the interface and pressure.  He is advised to be consistent with his treatment.  He is certainly motivated to get back on track and be compliant with treatment.  He is advised regarding the importance of treating severe sleep apnea.  We will try to help with adjustment to positive airway pressure treatment by reducing his pressure settings.  I will reduce his minimum pressure to 5 cm, maximum pressure to 13 cm at this time.  In addition, he may benefit from a mask refit.  We have requested this through his DME company.  He has looked up with a mask he would like to try, a ResMed F 30i hybrid style fullface mask.  In addition, he is advised to increase his ramp time from 15 minutes to the desired delay.  Is advised to follow-up in this clinic in 3 months routinely to see one of our nurse  practitioners and  call us in the interim or email through [facility] with any questions or concerns he may have.  I answered all his questions today and he was in agreement with the plan.   I spent 30 minutes in total face-to-face time and in reviewing records during pre-charting, more than 50% of which was spent in counseling and coordination of care, reviewing test results, reviewing medications and treatment regimen and/or in discussing or reviewing the diagnosis of OSA, the prognosis and treatment options. Pertinent laboratory and imaging test results that were available during this visit with the patient were reviewed by me and considered in my medical decision making (see chart for details).

## 2020-05-25 NOTE — Patient Instructions (Signed)
It was good to see you again today.  As discussed, we would like for the AutoPap to work out well for you.  I will make changes to your pressure, we will reduce your minimum pressure to 5 cm in maximum pressure to 13 cm, this may be easier to tolerate for you.  Please increase your ramp time to your liking.    We will ask [facility] to provide you with a different style of mask, such as the [place].  Since your home sleep test indicated severe OSA, we want you to be able to use your AutoPap consistently and also benefit from it.

## 2020-06-23 ENCOUNTER — Other Ambulatory Visit: Payer: Self-pay | Admitting: Internal Medicine

## 2020-06-23 DIAGNOSIS — I10 Essential (primary) hypertension: Secondary | ICD-10-CM

## 2020-07-24 ENCOUNTER — Ambulatory Visit: Payer: Managed Care, Other (non HMO) | Attending: Critical Care Medicine

## 2020-07-24 DIAGNOSIS — Z20822 Contact with and (suspected) exposure to covid-19: Secondary | ICD-10-CM

## 2020-07-25 LAB — SARS-COV-2, NAA 2 DAY TAT

## 2020-07-25 LAB — NOVEL CORONAVIRUS, NAA: SARS-CoV-2, NAA: NOT DETECTED

## 2020-08-14 ENCOUNTER — Ambulatory Visit: Payer: Managed Care, Other (non HMO) | Admitting: Internal Medicine

## 2020-08-14 ENCOUNTER — Encounter: Payer: Self-pay | Admitting: Internal Medicine

## 2020-08-14 ENCOUNTER — Other Ambulatory Visit: Payer: Self-pay

## 2020-08-14 VITALS — BP 126/74 | HR 74 | Temp 98.1°F | Resp 16 | Ht 71.0 in | Wt 242.2 lb

## 2020-08-14 DIAGNOSIS — J441 Chronic obstructive pulmonary disease with (acute) exacerbation: Secondary | ICD-10-CM

## 2020-08-14 DIAGNOSIS — Z7185 Encounter for immunization safety counseling: Secondary | ICD-10-CM

## 2020-08-14 DIAGNOSIS — Z720 Tobacco use: Secondary | ICD-10-CM

## 2020-08-14 MED ORDER — BUPROPION HCL 100 MG PO TABS: 100.0000 mg | ORAL_TABLET | Freq: Two times a day (BID) | ORAL | 6 refills | Status: DC

## 2020-08-14 NOTE — Patient Instructions (Addendum)
Start Wellbutrin 100 mg: 1 tablet daily for 10 days, then 1 tablet twice daily.  Quit tobacco 2 weeks after you started Wellbutrin  Start using nicotine supplements: You can start with nicotine patch 14 mg daily for the first 2 weeks Then you can drop to the 7 mg patch  patch daily for a while.   Very good information about quitting he found that the American  Heart association website   Proceed with your COVID vaccination at your earliest convenience.  You can do it at the first floor of this building    Steps to Quit Smoking Smoking tobacco is the leading cause of preventable death. It can affect almost every organ in the body. Smoking puts you and those around you at risk for developing many serious chronic diseases. Quitting smoking can be difficult, but it is one of the best things that you can do for your health. It is never too late to quit. How do I get ready to quit? When you decide to quit smoking, create a plan to help you succeed. Before you quit:  Pick a date to quit. Set a date within the next 2 weeks to give you time to prepare.  Write down the reasons why you are quitting. Keep this list in places where you will see it often.  Tell your family, friends, and co-workers that you are quitting. Support from your loved ones can make quitting easier.  Talk with your health care provider about your options for quitting smoking.  Find out what treatment options are covered by your health insurance.  Identify people, places, things, and activities that make you want to smoke (triggers). Avoid them. What first steps can I take to quit smoking?  Throw away all cigarettes at home, at work, and in your car.  Throw away smoking accessories, such as Scientist, research (medical).  Clean your car. Make sure to empty the ashtray.  Clean your home, including curtains and carpets. What strategies can I use to quit smoking? Talk with your health care provider about combining strategies,  such as taking medicines while you are also receiving in-person counseling. Using these two strategies together makes you more likely to succeed in quitting than if you used either strategy on its own.  If you are pregnant or breastfeeding, talk with your health care provider about finding counseling or other support strategies to quit smoking. Do not take medicine to help you quit smoking unless your health care provider tells you to do so. To quit smoking: Quit right away  Quit smoking completely, instead of gradually reducing how much you smoke over a period of time. Research shows that stopping smoking right away is more successful than gradually quitting.  Attend in-person counseling to help you build problem-solving skills. You are more likely to succeed in quitting if you attend counseling sessions regularly. Even short sessions of 10 minutes can be effective. Take medicine You may take medicines to help you quit smoking. Some medicines require a prescription and some you can purchase over-the-counter. Medicines may have nicotine in them to replace the nicotine in cigarettes. Medicines may:  Help to stop cravings.  Help to relieve withdrawal symptoms. Your health care provider may recommend:  Nicotine patches, gum, or lozenges.  Nicotine inhalers or sprays.  Non-nicotine medicine that is taken by mouth. Find resources Find resources and support systems that can help you to quit smoking and remain smoke-free after you quit. These resources are most helpful when you use  them often. They include:  Online chats with a Social worker.  Telephone quitlines.  Printed Furniture conservator/restorer.  Support groups or group counseling.  Text messaging programs.  Mobile phone apps or applications. Use apps that can help you stick to your quit plan by providing reminders, tips, and encouragement. There are many free apps for mobile devices as well as websites. Examples include Quit Guide from the [employer]  and smokefree.gov   What things can I do to make it easier to quit?  Reach out to your family and friends for support and encouragement. Call telephone quitlines (1-800-QUIT-NOW), reach out to support groups, or work with a counselor for support.  Ask people who smoke to avoid smoking around you.  Avoid places that trigger you to smoke, such as bars, parties, or smoke-break areas at work.  Spend time with people who do not smoke.  Lessen the stress in your life. Stress can be a smoking trigger for some people. To lessen stress, try: ? Exercising regularly. ? Doing deep-breathing exercises. ? Doing yoga. ? Meditating. ? Performing a body scan. This involves closing your eyes, scanning your body from head to toe, and noticing which parts of your body are particularly tense. Try to relax the muscles in those areas.   How will I feel when I quit smoking? Day 1 to 3 weeks Within the first 24 hours of quitting smoking, you may start to feel withdrawal symptoms. These symptoms are usually most noticeable 2-3 days after quitting, but they usually do not last for more than 2-3 weeks. You may experience these symptoms:  Mood swings.  Restlessness, anxiety, or irritability.  Trouble concentrating.  Dizziness.  Strong cravings for sugary foods and nicotine.  Mild weight gain.  Constipation.  Nausea.  Coughing or a sore throat.  Changes in how the medicines that you take for unrelated issues work in your body.  Depression.  Trouble sleeping (insomnia). Week 3 and afterward After the first 2-3 weeks of quitting, you may start to notice more positive results, such as:  Improved sense of smell and taste.  Decreased coughing and sore throat.  Slower heart rate.  Lower blood pressure.  Clearer skin.  The ability to breathe more easily.  Fewer sick days. Quitting smoking can be very challenging. Do not get discouraged if you are not successful the first time. Some people need  to make many attempts to quit before they achieve long-term success. Do your best to stick to your quit plan, and talk with your health care provider if you have any questions or concerns. Summary  Smoking tobacco is the leading cause of preventable death. Quitting smoking is one of the best things that you can do for your health.  When you decide to quit smoking, create a plan to help you succeed.  Quit smoking right away, not slowly over a period of time.  When you start quitting, seek help from your health care provider, family, or friends. This information is not intended to replace advice given to you by your health care provider. Make sure you discuss any questions you have with your health care provider. Document Revised: 11/20/2018 Document Reviewed: 05/16/2018 Elsevier Patient Education  [place].

## 2020-08-14 NOTE — Progress Notes (Signed)
Subjective:    Patient ID: Colin Watson, male    DOB: [birth year], 60 y.o.   MRN: [id number]  DOS:  08/14/2020 Type of visit - description: [place]  he developed DOE, wheezing, cough. Went to urgent care, COVID test was negative, he was treated with steroids, nebulization, and subsequently discharged on oral antibiotics and inhaler. Since then he is much improved and essentially he is now asymptomatic.  Because of the above he cut tobacco significantly from a pack a day to 3 to 4 cigarettes daily.  He is motivated to quit and requests help.  Review of Systems At this point no fever chills No chest pain No difficulty breathing, edema or palpitations.  Past Medical History:  Diagnosis Date  . Erectile dysfunction   . GERD (gastroesophageal reflux disease)   . Hypertension 09/23/2013  . 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, f/u at [facility]    . Nasal polyp     Past Surgical History:  Procedure Laterality Date  . cyst excision,wrist      Allergies as of 08/14/2020      Reactions   Ampicillin       Medication List       Accurate as of August 14, 2020 11:59 PM. If you have any questions, ask your nurse or doctor.        amLODipine 10 MG tablet Commonly known as: NORVASC Take 1 tablet (10 mg total) by mouth daily.   atorvastatin 20 MG tablet Commonly known as: LIPITOR Take 1 tablet (20 mg total) by mouth at bedtime.   buPROPion 100 MG tablet Commonly known as: WELLBUTRIN Take 1 tablet (100 mg total) by mouth 2 (two) times daily. Started by: Kathlene November, MD   carvedilol 25 MG tablet Commonly known as: COREG Take 1 tablet (25 mg total) by mouth 2 (two) times daily with a meal.   fluticasone 50 MCG/ACT nasal spray Commonly known as: FLONASE Place 2 sprays into both nostrils daily.   losartan-hydrochlorothiazide 100-12.5 MG tablet Commonly known as: HYZAAR Take 1 tablet by mouth daily.    tadalafil 20 MG tablet Commonly known as: CIALIS Take 1 tablet (20 mg total) by mouth every other day as needed for erectile dysfunction.          Objective:   Physical Exam BP 126/74 (BP Location: Left Arm, Patient Position: Sitting, Cuff Size: Normal)   Pulse 74   Temp 98.1 F (36.7 C) (Oral)   Resp 16   Ht 5\' 11"  (1.803 m)   Wt 242 lb 4 oz (109.9 kg)   SpO2 97%   BMI 33.79 kg/m  General:   Well developed, NAD, BMI noted. HEENT:  Normocephalic . Face symmetric, atraumatic Lungs:  CTA B Normal respiratory effort, no intercostal retractions, no accessory muscle use. Heart: RRR,  no murmur.  Lower extremities: no pretibial edema bilaterally  Skin: Not pale. Not jaundice Neurologic:  alert & oriented X3.  Speech normal, gait appropriate for age and unassisted Psych--  Cognition and judgment appear intact.  Cooperative with normal attention span and concentration.  Behavior appropriate. No anxious or depressed appearing.      Assessment     Assessment  HTN Hyperlipidemia:  GERD COPD per CT 2019 ED Nasal polyp H/o meningioma, R optic never sheet, XRT, f/u @ [facility] DERM: BCC, pre-cancer Nevus OSA, severe, DX 01/11/2021  PLAN COPD  exacerbation: As described above, treated elsewhere, doing better. Tobacco cessation: We had a long conversation about tobacco cessation.  He is interested and motivated on quitting. We talk about Chantix, Wellbutrin, elected Wellbutrin. I also encouraged to use nicotine supplements and look for more information on the American Heart Association website. See AVS. Vaccination advised: Encouraged to proceed with a COVID booster. RTC as scheduled for September   Time spent 30 minutes, extensive discussion about tobacco cessation including options, OTCs, available doses of nicotine supplements, multiple questions answered to the best of my ability.  This visit occurred during the SARS-CoV-2 public health emergency.  Safety  protocols were in place, including screening questions prior to the visit, additional usage of staff PPE, and extensive cleaning of exam room while observing appropriate contact time as indicated for disinfecting solutions.

## 2020-08-15 NOTE — Assessment & Plan Note (Signed)
COPD exacerbation: As described above, treated elsewhere, doing better. Tobacco cessation: We had a long conversation about tobacco cessation.  He is interested and motivated on quitting. We talk about Chantix, Wellbutrin, elected Wellbutrin. I also encouraged to use nicotine supplements and look for more information on the American Heart Association website. See AVS. Vaccination advised: Encouraged to proceed with a COVID booster. RTC as scheduled for September

## 2020-08-16 ENCOUNTER — Other Ambulatory Visit: Payer: Self-pay | Admitting: Internal Medicine

## 2020-08-16 DIAGNOSIS — I10 Essential (primary) hypertension: Secondary | ICD-10-CM

## 2020-08-29 ENCOUNTER — Ambulatory Visit: Payer: Managed Care, Other (non HMO) | Admitting: Family Medicine

## 2020-09-05 ENCOUNTER — Other Ambulatory Visit: Payer: Self-pay | Admitting: Internal Medicine

## 2020-09-19 ENCOUNTER — Ambulatory Visit: Payer: Managed Care, Other (non HMO) | Attending: Critical Care Medicine

## 2020-09-19 DIAGNOSIS — Z20822 Contact with and (suspected) exposure to covid-19: Secondary | ICD-10-CM

## 2020-09-20 LAB — SARS-COV-2, NAA 2 DAY TAT

## 2020-09-20 LAB — NOVEL CORONAVIRUS, NAA: SARS-CoV-2, NAA: DETECTED — AB

## 2020-09-21 ENCOUNTER — Telehealth: Payer: Self-pay | Admitting: Internal Medicine

## 2020-09-21 ENCOUNTER — Encounter: Payer: Self-pay | Admitting: Internal Medicine

## 2020-09-21 DIAGNOSIS — U071 COVID-19: Secondary | ICD-10-CM

## 2020-09-21 MED ORDER — MOLNUPIRAVIR EUA 200MG CAPSULE: 4.0000 | ORAL_CAPSULE | Freq: Two times a day (BID) | ORAL | 0 refills | Status: AC

## 2020-09-21 NOTE — Telephone Encounter (Signed)
Spoke w/ Pt- informed of recommendations. Pt agreed to try oral molnupiravir EUA 200 mg CAPS, Rx sent, instructed to start meds tonight, he will have someone that is not sick go pick it up for him. No medication interactions noted.

## 2020-09-21 NOTE — Telephone Encounter (Signed)
Received a message from the infusion center, results with molnupiravir are as good or better than with MAB. Please call patient, if he is willing to switch to molnupiravir send a prescription, needs to start a medication today. Then we will cancel the infusion

## 2020-09-21 NOTE — Addendum Note (Signed)
Addended byDamita Dunnings D on: 09/21/2020 04:29 PM   Modules accepted: Orders

## 2020-09-21 NOTE — Telephone Encounter (Signed)
MAB ordered. Will contact Pt shortly.

## 2020-09-21 NOTE — Telephone Encounter (Signed)
Spoke w/ Pt- informed of recommendations. Pt verbalized understanding.  

## 2020-09-21 NOTE — Telephone Encounter (Signed)
60 year old gentleman, history of COPD, baseline O2 sat 97%, smoker, OSA, no recent kidney function. Symptoms a started 4 days ago. History of COVID previously 11-2019. Vaccine status: Had [place]:  Advised patient if symptoms severe, chest pain, fever, difficulty breathing: Go to the ER Also if possible start checking O2 sats, if they are dropping below 94: ER Please arrange monoclonal antibody infusion given his risk.

## 2020-09-22 ENCOUNTER — Ambulatory Visit: Payer: Managed Care, Other (non HMO)

## 2020-11-29 ENCOUNTER — Encounter: Payer: Managed Care, Other (non HMO) | Admitting: Internal Medicine

## 2020-11-29 ENCOUNTER — Other Ambulatory Visit: Payer: Self-pay

## 2020-12-05 ENCOUNTER — Other Ambulatory Visit: Payer: Self-pay

## 2020-12-05 ENCOUNTER — Encounter (HOSPITAL_BASED_OUTPATIENT_CLINIC_OR_DEPARTMENT_OTHER): Payer: Self-pay | Admitting: Urology

## 2020-12-05 ENCOUNTER — Emergency Department (HOSPITAL_BASED_OUTPATIENT_CLINIC_OR_DEPARTMENT_OTHER): Payer: Managed Care, Other (non HMO)

## 2020-12-05 ENCOUNTER — Emergency Department (HOSPITAL_BASED_OUTPATIENT_CLINIC_OR_DEPARTMENT_OTHER)
Admission: EM | Admit: 2020-12-05 | Discharge: 2020-12-05 | Disposition: A | Discharge status: Home / Self Care | Payer: Managed Care, Other (non HMO) | Attending: Emergency Medicine | Admitting: Emergency Medicine

## 2020-12-05 ENCOUNTER — Ambulatory Visit
Admission: EM | Admit: 2020-12-05 | Discharge: 2020-12-05 | Disposition: A | Discharge status: Home / Self Care | Payer: Managed Care, Other (non HMO)

## 2020-12-05 DIAGNOSIS — I10 Essential (primary) hypertension: Secondary | ICD-10-CM | POA: Diagnosis not present

## 2020-12-05 DIAGNOSIS — Z20822 Contact with and (suspected) exposure to covid-19: Secondary | ICD-10-CM | POA: Diagnosis not present

## 2020-12-05 DIAGNOSIS — Z79899 Other long term (current) drug therapy: Secondary | ICD-10-CM | POA: Diagnosis not present

## 2020-12-05 DIAGNOSIS — Z7951 Long term (current) use of inhaled steroids: Secondary | ICD-10-CM | POA: Diagnosis not present

## 2020-12-05 DIAGNOSIS — R0602 Shortness of breath: Secondary | ICD-10-CM | POA: Insufficient documentation

## 2020-12-05 DIAGNOSIS — F17210 Nicotine dependence, cigarettes, uncomplicated: Secondary | ICD-10-CM | POA: Diagnosis not present

## 2020-12-05 DIAGNOSIS — R062 Wheezing: Secondary | ICD-10-CM | POA: Diagnosis not present

## 2020-12-05 DIAGNOSIS — R0902 Hypoxemia: Secondary | ICD-10-CM | POA: Diagnosis not present

## 2020-12-05 DIAGNOSIS — J029 Acute pharyngitis, unspecified: Secondary | ICD-10-CM | POA: Insufficient documentation

## 2020-12-05 DIAGNOSIS — J449 Chronic obstructive pulmonary disease, unspecified: Secondary | ICD-10-CM | POA: Insufficient documentation

## 2020-12-05 DIAGNOSIS — J441 Chronic obstructive pulmonary disease with (acute) exacerbation: Secondary | ICD-10-CM

## 2020-12-05 LAB — RESP PANEL BY RT-PCR (FLU A&B, COVID) ARPGX2
Influenza A by PCR: NEGATIVE
Influenza B by PCR: NEGATIVE
SARS Coronavirus 2 by RT PCR: NEGATIVE

## 2020-12-05 MED ORDER — IPRATROPIUM-ALBUTEROL 0.5-2.5 (3) MG/3ML IN SOLN
3.0000 mL | Freq: Once | RESPIRATORY_TRACT | Status: AC
  Administered 2020-12-05: 3 mL via RESPIRATORY_TRACT
  Filled 2020-12-05: qty 3

## 2020-12-05 MED ORDER — METHYLPREDNISOLONE SODIUM SUCC 125 MG IJ SOLR
125.0000 mg | Freq: Once | INTRAMUSCULAR | Status: AC
  Administered 2020-12-05: 125 mg via INTRAMUSCULAR

## 2020-12-05 MED ORDER — ALBUTEROL SULFATE HFA 108 (90 BASE) MCG/ACT IN AERS
2.0000 | INHALATION_SPRAY | Freq: Once | RESPIRATORY_TRACT | Status: AC
  Administered 2020-12-05: 2 via RESPIRATORY_TRACT

## 2020-12-05 MED ORDER — PREDNISONE 50 MG PO TABS: 50.0000 mg | ORAL_TABLET | Freq: Every day | ORAL | 0 refills | Status: AC

## 2020-12-05 NOTE — ED Provider Notes (Signed)
[facility] EMERGENCY DEPARTMENT Provider Note   CSN: [id number] Arrival date & time: 12/05/20  1043     History Chief Complaint  Patient presents with   Shortness of Breath    Colin Watson is a 60 y.o. male.  60 year old male with a history of hypertension and COPD presents today from urgent care for evaluation of dyspnea and hypoxia.  Patient reports Sunday he went to [employer] football game and felt fine.  He ambulated without any shortness of breath.  He reports Monday morning he woke up with slight sore throat for which he took Mucinex D around lunchtime.  He reports that then progressed to nonproductive cough that evening along with shortness of breath with exertion.  He reports he had some exertional dyspnea this morning as well and decided to present to the urgent care.  He received Solu-Medrol and 2 puffs of albuterol before presenting to this emergency room.  He reports his first COPD exacerbation was in May and he has been on albuterol as needed since.  He is not on any other maintenance therapy for COPD.  He reports he has not undergone PFTs either.  He denies PND, orthopnea, or peripheral edema.  Asides from hypertension he denies any previous cardiac history. He denies fever or chills.  Of note since May he reports he significantly cut down on his smoking.  Reports he is gone from a pack a day to 4 cigarettes/day.  The history is provided by the patient. No language interpreter was used.  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, f/u at [facility]     Nasal polyp     Patient Active Problem List   Diagnosis Date Noted   OSA on CPAP 04/01/2020   PCP NOTES >>>>> 02/22/2015   Tobacco abuse 11/29/2013   GERD (gastroesophageal reflux disease) 09/23/2013   Hypertension 09/23/2013   Annual physical exam 12/03/2011   ED (erectile dysfunction) 12/03/2011   MENINGIOMA 09/01/2008   NASAL POLYP 09/01/2008    Past Surgical History:  Procedure Laterality Date   cyst excision,wrist         Family History  Problem Relation Age of Onset   COPD Father    Bone cancer Paternal Grandmother    Diabetes Neg Hx    Heart disease Neg Hx    Colon cancer Neg Hx    Prostate cancer Neg Hx     Social History   Tobacco Use   Smoking status: Every Day    Packs/day: 1.00    Years: 33.00    Pack years: 33.00    Types: Cigarettes   Smokeless tobacco: Current   Tobacco comments:    ~1 ppd   Vaping Use   Vaping Use: Never used  Substance Use Topics   Alcohol use: Yes    Alcohol/week: 6.0 standard drinks    Types: 6 Cans of beer per week   Drug use:  No    Home Medications Prior to Admission medications   Medication Sig Start Date End Date Taking? Authorizing Provider  albuterol (VENTOLIN HFA) 108 (90 Base) MCG/ACT inhaler SMARTSIG:2 Puff(s) By Mouth Every 4-6 Hours PRN 07/22/20   [provider]  amLODipine (NORVASC) 10 MG tablet Take 10 mg by mouth daily. 09/24/20   [provider]  atorvastatin (LIPITOR) 20 MG tablet Take 1 tablet (20 mg total) by mouth at bedtime. 09/05/20   Colon Branch, MD  buPROPion (WELLBUTRIN) 100 MG tablet Take 1 tablet (100 mg total) by mouth 2 (two) times daily. 08/14/20   Colon Branch, MD  carvedilol (COREG) 25 MG tablet Take 1 tablet (25 mg total) by mouth 2 (two) times daily with a meal. 08/17/20   Paz, Alda Berthold, MD  fluticasone [facility]) 50 MCG/ACT nasal spray Place 2 sprays into  both nostrils daily. 05/04/18   Colon Branch, MD  losartan-hydrochlorothiazide [facility]) 100-12.5 MG tablet Take 1 tablet by mouth daily. 06/26/20   Colon Branch, MD  predniSONE (DELTASONE) 50 MG tablet Take 1 tablet (50 mg total) by mouth daily with breakfast for 5 days. 12/05/20 12/10/20  Wieters, Hallie C, PA-C  tadalafil (CIALIS) 20 MG tablet Take 1 tablet (20 mg total) by mouth every other day as needed for erectile dysfunction. 12/18/18   Colon Branch, MD    Allergies    Ampicillin  Review of Systems   Review of Systems  Constitutional:  Negative for activity change, chills, fatigue and fever.  HENT:  Positive for congestion and sore throat.   Respiratory:  Positive for shortness of breath and wheezing. Negative for chest tightness.   Cardiovascular:  Negative for chest pain, palpitations and leg swelling.  Gastrointestinal:  Negative for abdominal pain and nausea.  Musculoskeletal:  Negative for myalgias.  All other systems reviewed and are negative.  Physical Exam Updated Vital Signs BP (!) 149/98 (BP Location: Right Arm)   Pulse 77   Temp 98.2 F (36.8 C) (Oral)   Resp 20   Ht 5\' 11"  (1.803 m)   Wt 109.9 kg   SpO2 91%   BMI 33.79 kg/m   Physical Exam Vitals and nursing note reviewed.  Constitutional:      General: He is not in acute distress.    Appearance: He is well-developed. He is obese. He is not ill-appearing.  HENT:     Head: Normocephalic and atraumatic.  Cardiovascular:     Rate and Rhythm: Normal rate and regular rhythm.  Pulmonary:     Effort: Pulmonary effort is normal. No tachypnea or accessory muscle usage.     Breath sounds: Examination of the right-upper field reveals wheezing. Examination of the left-upper field reveals wheezing. Examination of the right-middle field reveals wheezing. Wheezing present. No rales.  Abdominal:     Tenderness: There is no abdominal tenderness.  Musculoskeletal:     Right lower leg: No edema.     Left lower leg: No edema.   Neurological:     General: No focal deficit present.     Mental Status: He is alert.    ED Results / Procedures / Treatments   Labs (all labs ordered are listed, but only abnormal results are displayed) Labs Reviewed  RESP PANEL BY RT-PCR (FLU A&B, COVID) ARPGX2    EKG None  Radiology No results found.  Procedures Procedures   Medications Ordered in ED Medications  ipratropium-albuterol (DUONEB) 0.5-2.5 (3) MG/3ML nebulizer solution 3 mL (3 mLs  Nebulization Given 12/05/20 1132)    ED Course  I have reviewed the triage vital signs and the nursing notes.  Pertinent labs & imaging results that were available during my care of the patient were reviewed by me and considered in my medical decision making (see chart for details).    MDM Rules/Calculators/A&P                           60 year old male with past medical history of COPD presents today for shortness of breath.  He has received 2 puffs of albuterol and Solu-Medrol.  We will give DuoNeb, swab for COVID, flu, and RSV.  Chest x-ray pending.  Patient's chest x-ray is unremarkable.  Given chest x-ray findings and improvement in his shortness of breath with DuoNeb unlikely that he has a PE.  Also unlikely to be ACS given lack of chest pain as mentioned above shortness of breath improved following DuoNeb. Resolution of wheezing, and dyspnea following DuoNeb. Patient ambulated around the emergency department without difficulty.  His saturations dropped from a resting sats of 94% to 89% with immediate recovery to 94%.  Given diagnosis of COPD this is likely his baseline saturations.  He is within goal saturations of a COPD patient. Patient discharged with plan to follow-up with PCP.  Discussed continuing Flonase and taken an antihistamine for his nasal congestion as well.  Discussed completing the prednisone course that was sent in by the urgent care.  Final Clinical Impression(s) / ED Diagnoses Final diagnoses:  None    Rx  / DC Orders ED Discharge Orders     None        Evlyn Courier, PA-C 12/05/20 1321    Lucrezia Starch, MD 12/07/20 1043

## 2020-12-05 NOTE — ED Notes (Signed)
Pt spo2 when ambulating down to 89-90% on RA, pt not short of breath though , RR 18. Non labored.

## 2020-12-05 NOTE — ED Provider Notes (Signed)
[facility]    CSN: [id number] Arrival date & time: 12/05/20  0908      History   Chief Complaint Chief Complaint  Patient presents with   Shortness of Breath   Cough    HPI Colin Watson is a 60 y.o. male history of hypertension, GERD, presenting today for evaluation of a cough.  Past 2 to 3 days has developed increased sore throat cough congestion shortness of breath and wheezing.  Reports history of COPD and similar exacerbation in May 2022, but worsening cough and wheezing this time.  Reports current tobacco use, trying to quit.  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 Authorizing Provider  predniSONE (DELTASONE) 50 MG tablet Take 1 tablet (50 mg total) by mouth daily with breakfast for 5 days. 12/05/20 12/10/20 Yes Bralon Antkowiak C, PA-C  albuterol (VENTOLIN HFA) 108 (90 Base) MCG/ACT inhaler SMARTSIG:2 Puff(s) By Mouth Every 4-6 Hours PRN 07/22/20   [provider]  amLODipine (NORVASC) 10 MG tablet Take 10 mg by mouth daily. 09/24/20   [provider]  atorvastatin (LIPITOR) 20 MG tablet Take 1 tablet (20 mg total) by mouth at bedtime. 09/05/20   Colon Branch, MD  buPROPion (WELLBUTRIN) 100 MG tablet Take 1 tablet (100  mg total) by mouth 2 (two) times daily. 08/14/20   Colon Branch, MD  carvedilol (COREG) 25 MG tablet Take 1 tablet (25 mg total) by mouth 2 (two) times daily with a meal. 08/17/20   Paz, Alda Berthold, MD  fluticasone [facility]) 50 MCG/ACT nasal spray Place 2 sprays into both nostrils daily. 05/04/18   Colon Branch, MD  losartan-hydrochlorothiazide [facility]) 100-12.5 MG tablet Take 1 tablet by mouth daily. 06/26/20   Colon Branch, MD  tadalafil (CIALIS) 20 MG tablet Take 1 tablet (20 mg total) by mouth every other day as needed for erectile dysfunction. 12/18/18   Colon Branch, MD    Family History Family History  Problem Relation Age of Onset   COPD Father    Bone cancer Paternal Grandmother    Diabetes Neg Hx    Heart disease Neg Hx    Colon cancer Neg Hx    Prostate cancer Neg Hx     Social History Social History   Tobacco Use   Smoking status: Every Day    Packs/day: 1.00    Years: 33.00    Pack years: 33.00    Types: Cigarettes   Smokeless tobacco: Current   Tobacco comments:    ~1 ppd   Vaping Use   Vaping Use: Never used  Substance Use Topics   Alcohol use: Yes  Alcohol/week: 6.0 standard drinks    Types: 6 Cans of beer per week   Drug use: No     Allergies   Ampicillin   Review of Systems Review of Systems  Constitutional:  Negative for activity change, appetite change, chills, fatigue and fever.  HENT:  Negative for congestion, ear pain, rhinorrhea, sinus pressure, sore throat and trouble swallowing.   Eyes:  Negative for discharge and redness.  Respiratory:  Positive for cough and shortness of breath. Negative for chest tightness.   Cardiovascular:  Negative for chest pain.  Gastrointestinal:  Negative for abdominal pain, diarrhea, nausea and vomiting.  Musculoskeletal:  Negative for myalgias.  Skin:  Negative for rash.  Neurological:  Negative for dizziness, light-headedness and headaches.    Physical Exam Triage Vital Signs ED Triage Vitals  Enc Vitals Group      BP      Pulse      Resp      Temp      Temp src      SpO2      Weight      Height      Head Circumference      Peak Flow      Pain Score      Pain Loc      Pain Edu?      Excl. in [place]?    No data found.  Updated Vital Signs BP (!) 143/83 (BP Location: Right Arm)   Pulse 76   Temp 98.2 F (36.8 C) (Oral)   Resp 20   SpO2 93%   Visual Acuity Right Eye Distance:   Left Eye Distance:   Bilateral Distance:    Right Eye Near:   Left Eye Near:    Bilateral Near:     Physical Exam Vitals and nursing note reviewed.  Constitutional:      Appearance: He is well-developed.     Comments: No acute distress  HENT:     Head: Normocephalic and atraumatic.     Nose: Nose normal.  Eyes:     Conjunctiva/sclera: Conjunctivae normal.  Cardiovascular:     Rate and Rhythm: Normal rate.  Pulmonary:     Effort: Pulmonary effort is normal. No respiratory distress.     Comments: O2 initially 88/89 on room air, averaging 91/92% with 1 L Appears slightly short of breath, but speaking in full sentences Inspiratory and expiratory wheezing and rhonchi noted throughout bilateral lung fields Abdominal:     General: There is no distension.  Musculoskeletal:        General: Normal range of motion.     Cervical back: Neck supple.  Skin:    General: Skin is warm and dry.  Neurological:     Mental Status: He is alert and oriented to person, place, and time.     UC Treatments / Results  Labs (all labs ordered are listed, but only abnormal results are displayed) Labs Reviewed - No data to display  EKG   Radiology No results found.  Procedures Procedures (including critical care time)  Medications Ordered in UC Medications  methylPREDNISolone sodium succinate (SOLU-MEDROL) 125 mg/2 mL injection 125 mg (125 mg Intramuscular Given 12/05/20 1003)  albuterol (VENTOLIN HFA) 108 (90 Base) MCG/ACT inhaler 2 puff (2 puffs Inhalation Given 12/05/20 1000)    Initial Impression / Assessment  and Plan / UC Course  I have reviewed the triage vital signs and the nursing notes.  Pertinent labs & imaging results that were available during  my care of the patient were reviewed by me and considered in my medical decision making (see chart for details).    Bronchitis/COPD exacerbation-Baseline O2 appears to be 97%, 88% on room air, improved to 92% with 1 L O2, but given decreased O2 level, significant wheezing patient likely will benefit from breathing treatments which are not currently offered in urgent care, does not have nebulizer machine at home, recommending further evaluation and treatment in emergency room, providing Solu-Medrol prior to discharge along with repeat albuterol inhaler puffs prior to leaving.  Transporting self via private vehicle.  Patient verbalized understanding and intent to go to ED.  Final Clinical Impressions(s) / UC Diagnoses   Final diagnoses:  COPD exacerbation [facility])     Discharge Instructions      Please go to emergency room for breathing treatments and further evaluation of oxygen level/cough We gave you a shot of Solu-Medrol Continue albuterol inhaler       ED Prescriptions     Medication Sig Dispense Auth. Provider   predniSONE (DELTASONE) 50 MG tablet Take 1 tablet (50 mg total) by mouth daily with breakfast for 5 days. 5 tablet Marco Raper, [place] C, PA-C      PDMP not reviewed this encounter.   Joneen Caraway [place] C, PA-C 12/05/20 1006

## 2020-12-05 NOTE — ED Triage Notes (Signed)
Pt states SOB since monday, states seen at UC this am, given albuterol inhaler and feeling better, UC sent for breathing treatment r/t low 02 sat

## 2020-12-05 NOTE — ED Triage Notes (Signed)
Pt reports that Sunday night he developed a cough with SOB.   Pt states he had a COPD exacerbation back in May.   Pt states he used his albuterol inhaler (2 puffs) around 0700 today.   Pt is not coughing at this time, able to hold conversation with me. Patient denies chest pain, chest when breathing is symmetrical.

## 2020-12-05 NOTE — Discharge Instructions (Addendum)
Complete 5-day prednisone course.  Follow-up with PCP on your scheduled appointment upcoming Friday.  Return to the emergency room if you have worsening shortness of breath. Call pulmonology to schedule a hospital follow up.

## 2020-12-05 NOTE — Discharge Instructions (Signed)
Please go to emergency room for breathing treatments and further evaluation of oxygen level/cough We gave you a shot of Solu-Medrol Continue albuterol inhaler

## 2020-12-05 NOTE — ED Notes (Signed)
Patient is being discharged from the Urgent Care and sent to the Emergency Department via [facility] . Per H. Wieters PA-C, patient is in need of higher level of care due to patient oxygen level (patient is stable and has no complaints at time of discharge). Patient is aware and verbalizes understanding of plan of care.  Vitals:   12/05/20 0943 12/05/20 1005  BP:    Pulse:    Resp:    Temp:    SpO2: 93% 93%

## 2020-12-08 ENCOUNTER — Encounter: Payer: Managed Care, Other (non HMO) | Admitting: Internal Medicine

## 2020-12-13 ENCOUNTER — Encounter: Payer: Self-pay | Admitting: Internal Medicine

## 2020-12-13 ENCOUNTER — Other Ambulatory Visit: Payer: Self-pay

## 2020-12-13 ENCOUNTER — Ambulatory Visit (INDEPENDENT_AMBULATORY_CARE_PROVIDER_SITE_OTHER): Payer: Managed Care, Other (non HMO) | Admitting: Internal Medicine

## 2020-12-13 VITALS — BP 126/74 | HR 73 | Temp 98.2°F | Resp 16 | Ht 71.0 in | Wt 245.4 lb

## 2020-12-13 DIAGNOSIS — I10 Essential (primary) hypertension: Secondary | ICD-10-CM

## 2020-12-13 DIAGNOSIS — J441 Chronic obstructive pulmonary disease with (acute) exacerbation: Secondary | ICD-10-CM

## 2020-12-13 DIAGNOSIS — F17200 Nicotine dependence, unspecified, uncomplicated: Secondary | ICD-10-CM

## 2020-12-13 DIAGNOSIS — G4733 Obstructive sleep apnea (adult) (pediatric): Secondary | ICD-10-CM | POA: Diagnosis not present

## 2020-12-13 DIAGNOSIS — Z9989 Dependence on other enabling machines and devices: Secondary | ICD-10-CM | POA: Diagnosis not present

## 2020-12-13 DIAGNOSIS — Z0001 Encounter for general adult medical examination with abnormal findings: Secondary | ICD-10-CM

## 2020-12-13 DIAGNOSIS — J449 Chronic obstructive pulmonary disease, unspecified: Secondary | ICD-10-CM

## 2020-12-13 DIAGNOSIS — Z0000 Encounter for general adult medical examination without abnormal findings: Secondary | ICD-10-CM

## 2020-12-13 DIAGNOSIS — Z122 Encounter for screening for malignant neoplasm of respiratory organs: Secondary | ICD-10-CM

## 2020-12-13 LAB — CBC WITH DIFFERENTIAL/PLATELET
Basophils Absolute: 0 10*3/uL (ref 0.0–0.1)
Basophils Relative: 0.4 % (ref 0.0–3.0)
Eosinophils Absolute: 0.7 10*3/uL (ref 0.0–0.7)
Eosinophils Relative: 6.8 % — ABNORMAL HIGH (ref 0.0–5.0)
HCT: 40.2 % (ref 39.0–52.0)
Hemoglobin: 13.4 g/dL (ref 13.0–17.0)
Lymphocytes Relative: 30.3 % (ref 12.0–46.0)
Lymphs Abs: 3 10*3/uL (ref 0.7–4.0)
MCHC: 33.3 g/dL (ref 30.0–36.0)
MCV: 94.1 fl (ref 78.0–100.0)
Monocytes Absolute: 0.8 10*3/uL (ref 0.1–1.0)
Monocytes Relative: 8 % (ref 3.0–12.0)
Neutro Abs: 5.4 10*3/uL (ref 1.4–7.7)
Neutrophils Relative %: 54.5 % (ref 43.0–77.0)
Platelets: 333 10*3/uL (ref 150.0–400.0)
RBC: 4.27 Mil/uL (ref 4.22–5.81)
RDW: 13 % (ref 11.5–15.5)
WBC: 9.9 10*3/uL (ref 4.0–10.5)

## 2020-12-13 LAB — LIPID PANEL
Cholesterol: 152 mg/dL (ref 0–200)
HDL: 63.4 mg/dL (ref >39.00)
LDL Cholesterol: 67 mg/dL (ref 0–99)
NonHDL: 88.32
Total CHOL/HDL Ratio: 2
Triglycerides: 109 mg/dL (ref 0.0–149.0)
VLDL: 21.8 mg/dL (ref 0.0–40.0)

## 2020-12-13 LAB — COMPREHENSIVE METABOLIC PANEL
ALT: 25 U/L (ref 0–53)
AST: 13 U/L (ref 0–37)
Albumin: 4.2 g/dL (ref 3.5–5.2)
Alkaline Phosphatase: 77 U/L (ref 39–117)
BUN: 12 mg/dL (ref 6–23)
CO2: 29 mEq/L (ref 19–32)
Calcium: 9.5 mg/dL (ref 8.4–10.5)
Chloride: 96 mEq/L (ref 96–112)
Creatinine, Ser: 0.82 mg/dL (ref 0.40–1.50)
GFR: 94.42 mL/min (ref >60.00)
Glucose, Bld: 108 mg/dL — ABNORMAL HIGH (ref 70–99)
Potassium: 5.2 mEq/L — ABNORMAL HIGH (ref 3.5–5.1)
Sodium: 132 mEq/L — ABNORMAL LOW (ref 135–145)
Total Bilirubin: 0.8 mg/dL (ref 0.2–1.2)
Total Protein: 6.2 g/dL (ref 6.0–8.3)

## 2020-12-13 LAB — TSH: TSH: 1.13 u[IU]/mL (ref 0.35–5.50)

## 2020-12-13 MED ORDER — ANORO ELLIPTA 62.5-25 MCG/INH IN AEPB: 1.0000 | INHALATION_SPRAY | Freq: Every day | RESPIRATORY_TRACT | 3 refills | Status: DC

## 2020-12-13 MED ORDER — AZITHROMYCIN 250 MG PO TABS: ORAL_TABLET | ORAL | 0 refills | Status: DC

## 2020-12-13 NOTE — Progress Notes (Signed)
Subjective:    Patient ID: Colin Watson, male    DOB: 07-11-1960, 60 y.o.   MRN: [id number]  DOS:  12/13/2020 Type of visit - description: CPX  Here for CPX We also had a long discussion about COPD. He was seen few days ago at the ER with another COPD exacerbation.  X-ray negative, was Rx prednisone. Overall cough and wheezing decreased but not gone. Still has some sputum production. No fever chills. 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 If you have any questions, ask your nurse or doctor.          albuterol 108 (90 Base) MCG/ACT inhaler Commonly known as: VENTOLIN HFA SMARTSIG:2 Puff(s) By Mouth Every 4-6 Hours PRN   amLODipine 10 MG tablet Commonly known as: NORVASC Take 10 mg by mouth daily.   atorvastatin 20 MG tablet Commonly known as: LIPITOR Take 1 tablet (20 mg total) by mouth at bedtime.   buPROPion 100 MG tablet Commonly known as: WELLBUTRIN Take 1 tablet (100 mg total) by mouth 2 (two) times daily.   carvedilol 25 MG tablet Commonly known as: COREG Take 1 tablet (25 mg total) by mouth 2 (two) times daily with a meal.   fluticasone 50 MCG/ACT nasal spray Commonly known as: FLONASE Place 2 sprays into both nostrils daily.   losartan-hydrochlorothiazide 100-12.5 MG tablet Commonly known as: HYZAAR Take 1 tablet by mouth daily.   tadalafil 20 MG tablet Commonly known as: CIALIS Take 1 tablet (20 mg  total) by mouth every other day as needed for erectile dysfunction.           Objective:   Physical Exam BP 126/74 (BP Location: Left Arm, Patient Position: Sitting, Cuff Size: Normal)   Pulse 73   Temp 98.2 F (36.8 C) (Oral)   Resp 16   Ht 5\' 11"  (1.803 m)   Wt 245 lb 6 oz (111.3 kg)   SpO2 97%   BMI 34.22 kg/m  General: Well developed, NAD, BMI noted Neck: No  thyromegaly  HEENT:  Normocephalic . Face symmetric, atraumatic Lungs:  Increased expiratory time, + large airway congestion, very mild wheezing.  No crackles. Normal respiratory effort, no intercostal retractions, no accessory muscle use. Heart: RRR,  no murmur.  Abdomen:  Not distended, soft, non-tender. No rebound or rigidity.   Lower extremities: no pretibial edema bilaterally  Skin: Exposed areas without rash. Not pale. Not jaundice Neurologic:  alert & oriented X3.  Speech normal, gait appropriate for age and unassisted Strength symmetric and appropriate for age.  Psych: Cognition and judgment appear intact.  Cooperative with normal attention span and concentration.  Behavior appropriate. No anxious or depressed appearing.     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  Continue amlodipine High cholesterol: Continue Lipitor, check FLP. COPD: w/ recent  exacerbation, went to the ER 12/05/2020.  Prescribed prednisone.  It has helped, symptoms not completely gone, still has wheezing, large airway congestion. Plan: Start Anoro, albuterol as needed, continue Mucinex, Zithromax. Schedule PFT in 2 months from today Reevaluate in 3 months OSA: Still working on using CPAP 100%. Right optic hemangioma: Diagnosed and treated about 20 years ago,  recently the area was noted to be enlarging, s/p gamma knife treatment on 10-2020 RTC 3 months  In addition to CPX, we addressed all his chronic medical problems including COPD which is not well controlled.  This visit occurred during the SARS-CoV-2 public health emergency.  Safety protocols were in place, including screening questions prior to the visit, additional usage of staff PPE, and extensive cleaning of exam room while observing appropriate contact time as indicated for disinfecting solutions.

## 2020-12-13 NOTE — Patient Instructions (Signed)
Recommend the following vaccinations Shingrix Flu shot COVID booster  Check the  blood pressure regularly BP GOAL is between 110/65 and  135/85. If it is consistently higher or lower, let me know  Tests: Lung cancer screening CT Pulmonary function test in 2 months  Emphysema: Anoro Ellipta daily Albuterol as needed Zithromax   GO TO THE LAB : Get the blood work     [place], [place] back for a checkup in 3 months

## 2020-12-14 ENCOUNTER — Encounter: Payer: Self-pay | Admitting: Internal Medicine

## 2020-12-14 NOTE — Assessment & Plan Note (Signed)
Here for CPX HTN: Well-controlled.  Continue amlodipine High cholesterol: Continue Lipitor, check FLP. COPD: w/ recent  exacerbation, went to the ER 12/05/2020.  Prescribed prednisone.  It has helped, symptoms not completely gone, still has wheezing, large airway congestion. Plan: Start Anoro, albuterol as needed, continue Mucinex, Zithromax. Schedule PFT in 2 months from today Reevaluate in 3 months OSA: Still working on using CPAP 100%. Right optic hemangioma: Diagnosed and treated about 20 years ago, recently the area was noted to be enlarging, s/p gamma knife treatment on 10-2020 RTC 3 months

## 2020-12-14 NOTE — Assessment & Plan Note (Signed)
-  Td  09/2016 - shingrex  recommended  - s/p covid shot  (JJ), booster strongly recommended. -  flu shot : plans to do at work -Prostate cancer screening: No FH, DRE and  PSA wnl 12-2019 -CCS: Cscope 04-2016, polyps, 5 years -Lung cancer screening: Order a CT -Labs: CMP, FLP, CBC, TSH -Diet and exercise discussed, advised to expect weight gain when he completely quit tobacco . -Tobacco: On Wellbutrin, still struggling but he is determined to completely quit, currently smoking very little.

## 2020-12-19 NOTE — Telephone Encounter (Signed)
error 

## 2020-12-20 ENCOUNTER — Ambulatory Visit: Payer: Managed Care, Other (non HMO) | Admitting: Family Medicine

## 2020-12-23 ENCOUNTER — Other Ambulatory Visit: Payer: Self-pay | Admitting: Internal Medicine

## 2021-01-01 NOTE — Patient Instructions (Incomplete)

## 2021-01-01 NOTE — Progress Notes (Deleted)
PATIENT: Colin Watson: Mar 06, 1961  REASON FOR VISIT: follow up HISTORY FROM: patient  No chief complaint on file.    HISTORY OF PRESENT ILLNESS:  01/01/21 ALL:  Colin Watson is a 60 y.o. male here today for follow up for OSA on CPAP.    He has not used CPAP in the past 90 days.   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  The patient is unaccompanied today.  I first met him at the request of his primary care physician on 12/07/2019, at which time he reported snoring and excessive daytime somnolence.  He had witnessed apneas as well.  He was advised to proceed with sleep study.  He had a home sleep test on 01/12/2020 which indicated severe obstructive sleep apnea with an AHI of 46.6/h, O2 nadir 84%. He was advised to start AutoPap therapy.  His set up date was 03/14/2020.   Today, 05/25/2020: I reviewed his AutoPap compliance data for the past 70 days from 03/14/2020 through 05/22/2020, during which time he used his machine only 8 days with percent use days greater than 4 hours at 0%.  Average AHI when he used his machine was 2.7/h, 95th percentile of pressure at 8.6 cm with a range of 7 to 15 cm with EPR, leak on the low side with a 95th percentile at 3 L/min.  He reports that his mask is not conducive to side sleeping.  He is motivated to continue with treatment.  He would like to consider a different mask.  He also feels that the machine ramps up too quickly.  He would like to reduce the ramp time.  He feels that the pressure goes up too high.  He would like to see if we can reduce the pressure settings.   REVIEW OF SYSTEMS: Out of a complete 14  system review of symptoms, the patient complains only of the following symptoms, and all other reviewed systems are negative.  ESS:  ALLERGIES: Allergies  Allergen Reactions   Ampicillin     HOME MEDICATIONS: Outpatient Medications Prior to Visit  Medication Sig Dispense Refill   albuterol (VENTOLIN HFA) 108 (90 Base) MCG/ACT inhaler SMARTSIG:2 Puff(s) By Mouth Every 4-6 Hours PRN     amLODipine (NORVASC) 10 MG tablet TAKE 1 TABLET DAILY 90 tablet 1   atorvastatin (LIPITOR) 20 MG tablet Take 1 tablet (20 mg total) by mouth at bedtime. 90 tablet 1   azithromycin (ZITHROMAX Z-PAK) 250 MG tablet 2 tabs a day the first day, then 1 tab a day x 4 days 6 tablet 0   buPROPion (WELLBUTRIN) 100 MG tablet Take 1 tablet (100 mg total) by mouth 2 (two) times daily. 60 tablet 6   carvedilol (COREG) 25 MG tablet Take 1 tablet (25 mg total) by mouth 2 (two) times daily with a meal. 180 tablet 1   fluticasone (FLONASE) 50 MCG/ACT nasal spray Place 2 sprays into both nostrils daily. 16 g 5   losartan-hydrochlorothiazide (HYZAAR) 100-12.5 MG tablet TAKE 1 TABLET DAILY 90 tablet 1   tadalafil (CIALIS) 20 MG tablet Take 1 tablet (20 mg total) by mouth every other day as needed for erectile dysfunction.     umeclidinium-vilanterol (ANORO ELLIPTA)  62.5-25 MCG/INH AEPB Inhale 1 puff into the lungs daily. 60 each 3   No facility-administered medications prior to visit.    PAST MEDICAL HISTORY: Past Medical History:  Diagnosis Date   Erectile dysfunction    GERD (gastroesophageal reflux disease)    Hypertension 09/23/2013   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, f/u at [facility]     Nasal polyp     PAST SURGICAL HISTORY: Past Surgical History:  Procedure Laterality Date   cyst excision,wrist      FAMILY HISTORY: Family History  Problem Relation Age of Onset   COPD Father    Bone cancer Paternal Grandmother    Diabetes Neg Hx    Heart disease Neg Hx    Colon  cancer Neg Hx    Prostate cancer Neg Hx     SOCIAL HISTORY: Social History   Socioeconomic History   Marital status: Married    Spouse name: Not on file   Number of children: 2   Years of education: 16   Highest education level: Not on file  Occupational History   Occupation: Optometrist for a local Co  Tobacco Use   Smoking status: Every Day    Packs/day: 1.00    Years: 33.00    Pack years: 33.00    Types: Cigarettes   Smokeless tobacco: Current   Tobacco comments:    ~1 ppd   Vaping Use   Vaping Use: Never used  Substance and Sexual Activity   Alcohol use: Yes    Alcohol/week: 6.0 standard drinks    Types: 6 Cans of beer per week   Drug use: No   Sexual activity: Yes    Partners: Female  Other Topics Concern   Not on file  Social History Narrative   HSG, [place] - [employer]. Married '82. 1 daughter, 1 son. Work - Optometrist    Marriage in good health      Lives at home with wife   Right handed   Caffeine: at least 4 cups/day   Social Determinants of Health   Financial Resource Strain: Not on file  Food Insecurity: Not on file  Transportation Needs: Not on file  Physical Activity: Not on file  Stress: Not on file  Social Connections: Not on file  Intimate Partner Violence: Not on file     PHYSICAL EXAM  There were no vitals filed for this visit. There is no height or weight on file to calculate BMI.  Generalized: Well developed, in no acute distress  Cardiology: normal rate and rhythm, no murmur noted Respiratory: clear to auscultation bilaterally  Neurological examination  Mentation: Alert oriented to time, place, history taking. Follows all commands speech and language fluent Cranial nerve II-XII: Pupils were equal round reactive to light. Extraocular movements were full, visual field were full  Motor: The motor testing reveals 5 over 5 strength of all 4 extremities. Good symmetric motor tone is noted throughout.  Gait and station:  Gait is normal.    DIAGNOSTIC DATA (LABS, IMAGING, TESTING) - I reviewed patient records, labs, notes, testing and imaging myself where available.  No flowsheet data found.   Lab Results  Component Value Date   WBC 9.9 12/13/2020   HGB 13.4 12/13/2020   HCT 40.2 12/13/2020   MCV 94.1 12/13/2020   PLT 333.0 12/13/2020      Component Value Date/Time   NA 132 (L) 12/13/2020 1011   K 5.2  No hemolysis seen (H) 12/13/2020 1011   CL 96 12/13/2020 1011   CO2 29 12/13/2020 1011   GLUCOSE 108 (H) 12/13/2020 1011   BUN 12 12/13/2020 1011   CREATININE 0.82 12/13/2020 1011   CREATININE 0.75 12/14/2019 0916   CALCIUM 9.5 12/13/2020 1011   PROT 6.2 12/13/2020 1011   ALBUMIN 4.2 12/13/2020 1011   AST 13 12/13/2020 1011   ALT 25 12/13/2020 1011   ALKPHOS 77 12/13/2020 1011   BILITOT 0.8 12/13/2020 1011   GFRNONAA 95.02 08/16/2008 1606   Lab Results  Component Value Date   CHOL 152 12/13/2020   HDL 63.40 12/13/2020   LDLCALC 67 12/13/2020   LDLDIRECT 110.3 12/02/2011   TRIG 109.0 12/13/2020   CHOLHDL 2 12/13/2020   Lab Results  Component Value Date   HGBA1C 5.4 12/14/2019   No results found for: VITAMINB12 Lab Results  Component Value Date   TSH 1.13 12/13/2020     ASSESSMENT AND PLAN 60 y.o. year old male  has a past medical history of Erectile dysfunction, GERD (gastroesophageal reflux disease), Hypertension (09/23/2013), Meningioma of optic nerve sheath ([place]), and Nasal polyp. here with   No diagnosis found.    LAMIN CHANDLEY is doing well on CPAP therapy. Compliance report reveals ***. *** was encouraged to continue using CPAP nightly and for greater than 4 hours each night. We will update supply orders as indicated. Risks of untreated sleep apnea review and education materials provided. Healthy lifestyle habits encouraged. *** will follow up in ***, sooner if needed. *** verbalizes understanding and agreement with this plan.    No orders of the defined types were  placed in this encounter.    No orders of the defined types were placed in this encounter.     Debbora Presto, FNP-C 01/01/2021, 4:22 PM [facility] [address], [place], [place] [address] [phone]

## 2021-01-03 ENCOUNTER — Ambulatory Visit: Payer: Managed Care, Other (non HMO) | Admitting: Family Medicine

## 2021-01-03 ENCOUNTER — Encounter: Payer: Self-pay | Admitting: Family Medicine

## 2021-01-03 ENCOUNTER — Other Ambulatory Visit: Payer: Self-pay

## 2021-01-03 VITALS — BP 136/76 | HR 71 | Ht 71.0 in | Wt 246.8 lb

## 2021-01-03 DIAGNOSIS — G4733 Obstructive sleep apnea (adult) (pediatric): Secondary | ICD-10-CM | POA: Diagnosis not present

## 2021-01-03 DIAGNOSIS — Z789 Other specified health status: Secondary | ICD-10-CM | POA: Diagnosis not present

## 2021-01-03 NOTE — Patient Instructions (Signed)
Please continue using your CPAP regularly. While your insurance requires that you use CPAP at least 4 hours each night on 70% of the nights, I recommend, that you not skip any nights and use it throughout the night if you can. Getting used to CPAP and staying with the treatment long term does take time and patience and discipline. Untreated obstructive sleep apnea when it is moderate to severe can have an adverse impact on cardiovascular health and raise her risk for heart disease, arrhythmias, hypertension, congestive heart failure, stroke and diabetes. Untreated obstructive sleep apnea causes sleep disruption, nonrestorative sleep, and sleep deprivation. This can have an impact on your day to day functioning and cause daytime sleepiness and impairment of cognitive function, memory loss, mood disturbance, and problems focussing. Using CPAP regularly can improve these symptoms.  Focus on putting your mask on every night. Build on adding time each day. I am very proud of your commitment to stop smoking.   Follow up in 6 months

## 2021-01-03 NOTE — Progress Notes (Signed)
PATIENT: Colin Watson DOB: September 18, 1960  REASON FOR VISIT: follow up HISTORY FROM: patient  Chief Complaint  Patient presents with   Follow-up   Obstructive Sleep Apnea    New rm, alone. Here for 6 month CPAP f/u. Pt reports haivng COPD exacerbation in May and September 27, Covid in July 12 and has not been able to use his CPAP. Pt will be using his CPAP now that he's feeling better.      HISTORY OF PRESENT ILLNESS:  01/03/21 ALL:  REBECCA MOTTA is a 60 y.o. male here today for follow up for OSA on CPAP. He was doing well on therapy at last visit with exception of some difficulty with mask leaking as he is a side sleeper. He was able to get a new mask and now using F30. He likes it much better. Unfortunately, he has had several medical events that have prevented usage for the past few months. He is trying to quit smoking. He is down to 2-3 cigarettes a day.   He has not used CPAP in the past 90 days.   HISTORY: (copied from Dr Guadelupe Sabin previous note)  Mr. 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  The patient is unaccompanied today.  I first met him at the request of his primary care physician on 12/07/2019, at which time he reported snoring and excessive daytime somnolence.  He had witnessed apneas as well.  He was advised to proceed with sleep study.  He had a home sleep test on 01/12/2020 which indicated severe obstructive sleep apnea with an AHI of 46.6/h, O2 nadir 84%. He was advised to start AutoPap therapy.  His set up date was 03/14/2020.   Today, 05/25/2020: I reviewed his AutoPap compliance data for the past 70 days from 03/14/2020 through 05/22/2020, during which time he used his machine only 8 days  with percent use days greater than 4 hours at 0%.  Average AHI when he used his machine was 2.7/h, 95th percentile of pressure at 8.6 cm with a range of 7 to 15 cm with EPR, leak on the low side with a 95th percentile at 3 L/min.  He reports that his mask is not conducive to side sleeping.  He is motivated to continue with treatment.  He would like to consider a different mask.  He also feels that the machine ramps up too quickly.  He would like to reduce the ramp time.  He feels that the pressure goes up too high.  He would like to see if we can reduce the pressure settings.   REVIEW OF SYSTEMS: Out of a complete 14 system review of symptoms, the patient complains only of the following symptoms, shortness of breath and all other reviewed systems are negative.  ESS: 6  ALLERGIES: Allergies  Allergen Reactions   Ampicillin     HOME MEDICATIONS: Outpatient Medications Prior to Visit  Medication Sig Dispense Refill   albuterol (VENTOLIN HFA) 108 (90 Base) MCG/ACT inhaler SMARTSIG:2 Puff(s) By Mouth Every 4-6 Hours PRN     amLODipine (NORVASC) 10 MG tablet TAKE 1 TABLET DAILY 90 tablet 1   atorvastatin (LIPITOR) 20 MG tablet Take 1 tablet (20 mg total) by mouth at bedtime. 90 tablet 1   azithromycin (ZITHROMAX Z-PAK)  250 MG tablet 2 tabs a day the first day, then 1 tab a day x 4 days 6 tablet 0   buPROPion (WELLBUTRIN) 100 MG tablet Take 1 tablet (100 mg total) by mouth 2 (two) times daily. 60 tablet 6   carvedilol (COREG) 25 MG tablet Take 1 tablet (25 mg total) by mouth 2 (two) times daily with a meal. 180 tablet 1   fluticasone (FLONASE) 50 MCG/ACT nasal spray Place 2 sprays into both nostrils daily. 16 g 5   losartan-hydrochlorothiazide (HYZAAR) 100-12.5 MG tablet TAKE 1 TABLET DAILY 90 tablet 1   tadalafil (CIALIS) 20 MG tablet Take 1 tablet (20 mg total) by mouth every other day as needed for erectile dysfunction.     umeclidinium-vilanterol (ANORO ELLIPTA) 62.5-25 MCG/INH AEPB Inhale 1  puff into the lungs daily. 60 each 3   No facility-administered medications prior to visit.    PAST MEDICAL HISTORY: Past Medical History:  Diagnosis Date   Erectile dysfunction    GERD (gastroesophageal reflux disease)    Hypertension 09/23/2013   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, f/u at [facility]     Nasal polyp     PAST SURGICAL HISTORY: Past Surgical History:  Procedure Laterality Date   cyst excision,wrist      FAMILY HISTORY: Family History  Problem Relation Age of Onset   COPD Father    Bone cancer Paternal Grandmother    Diabetes Neg Hx    Heart disease Neg Hx    Colon cancer Neg Hx    Prostate cancer Neg Hx     SOCIAL HISTORY: Social History   Socioeconomic History   Marital status: Married    Spouse name: Not on file   Number of children: 2   Years of education: 16   Highest education level: Not on file  Occupational History   Occupation: Optometrist for a local Co  Tobacco Use   Smoking status: Every Day    Packs/day: 1.00    Years: 33.00    Pack years: 33.00    Types: Cigarettes   Smokeless tobacco: Current   Tobacco comments:    ~1 ppd   Vaping Use   Vaping Use: Never used  Substance and Sexual Activity   Alcohol use: Yes    Alcohol/week: 6.0 standard drinks    Types: 6 Cans of beer per week   Drug use: No   Sexual activity: Yes    Partners: Female  Other Topics Concern   Not on file  Social History Narrative   HSG, [place] - [employer]. Married '82. 1 daughter, 1 son. Work - Optometrist    Marriage in good health      Lives at home with wife   Right handed   Caffeine: at least 4 cups/day   Social Determinants of Health   Financial Resource Strain: Not on file  Food Insecurity: Not on file  Transportation Needs: Not on file  Physical Activity: Not on file  Stress: Not on file  Social Connections: Not on file  Intimate Partner Violence: Not on file     PHYSICAL  EXAM  Vitals:   01/03/21 0929  BP: 136/76  Pulse: 71  Weight: 246 lb 12.8 oz (111.9 kg)  Height: 5' 11"  (1.803 m)   Body mass index is 34.42 kg/m.  Generalized: Well developed, in no acute distress  Cardiology: normal rate and rhythm, no murmur noted Respiratory: clear to auscultation  bilaterally  Neurological examination  Mentation: Alert oriented to time, place, history taking. Follows all commands speech and language fluent Cranial nerve II-XII: Pupils were equal round reactive to light. Extraocular movements were full, visual field were full  Motor: The motor testing reveals 5 over 5 strength of all 4 extremities. Good symmetric motor tone is noted throughout.  Gait and station: Gait is normal.    DIAGNOSTIC DATA (LABS, IMAGING, TESTING) - I reviewed patient records, labs, notes, testing and imaging myself where available.  No flowsheet data found.   Lab Results  Component Value Date   WBC 9.9 12/13/2020   HGB 13.4 12/13/2020   HCT 40.2 12/13/2020   MCV 94.1 12/13/2020   PLT 333.0 12/13/2020      Component Value Date/Time   NA 132 (L) 12/13/2020 1011   K 5.2 No hemolysis seen (H) 12/13/2020 1011   CL 96 12/13/2020 1011   CO2 29 12/13/2020 1011   GLUCOSE 108 (H) 12/13/2020 1011   BUN 12 12/13/2020 1011   CREATININE 0.82 12/13/2020 1011   CREATININE 0.75 12/14/2019 0916   CALCIUM 9.5 12/13/2020 1011   PROT 6.2 12/13/2020 1011   ALBUMIN 4.2 12/13/2020 1011   AST 13 12/13/2020 1011   ALT 25 12/13/2020 1011   ALKPHOS 77 12/13/2020 1011   BILITOT 0.8 12/13/2020 1011   GFRNONAA 95.02 08/16/2008 1606   Lab Results  Component Value Date   CHOL 152 12/13/2020   HDL 63.40 12/13/2020   LDLCALC 67 12/13/2020   LDLDIRECT 110.3 12/02/2011   TRIG 109.0 12/13/2020   CHOLHDL 2 12/13/2020   Lab Results  Component Value Date   HGBA1C 5.4 12/14/2019   No results found for: VITAMINB12 Lab Results  Component Value Date   TSH 1.13 12/13/2020     ASSESSMENT AND  PLAN 60 y.o. year old male  has a past medical history of Erectile dysfunction, GERD (gastroesophageal reflux disease), Hypertension (09/23/2013), Meningioma of optic nerve sheath ([place]), and Nasal polyp. here with     ICD-10-CM   1. OSA (obstructive sleep apnea)  G47.33     2. Difficulty using continuous positive airway pressure (CPAP) device  Z78.9         KEATIN BENHAM has not used CPAP since last being seen. He contributed this to COPD exacerbation and Covid 19 infection. He is motivated to restart CPAP. He was encouraged to use CPAP nightly and for greater than 4 hours each night. Risks of untreated sleep apnea review and education materials provided. He was encouraged to continue working on smoking cessation. Healthy lifestyle habits encouraged. He will follow up in 6 months, sooner if needed. He verbalizes understanding and agreement with this plan.    No orders of the defined types were placed in this encounter.    No orders of the defined types were placed in this encounter.     Debbora Presto, FNP-C 01/03/2021, 10:59 AM [facility] [address], [place], [place] [address] [phone]

## 2021-03-13 ENCOUNTER — Other Ambulatory Visit: Payer: Self-pay | Admitting: Internal Medicine

## 2021-03-13 DIAGNOSIS — I10 Essential (primary) hypertension: Secondary | ICD-10-CM

## 2021-03-23 ENCOUNTER — Ambulatory Visit: Payer: Managed Care, Other (non HMO) | Admitting: Internal Medicine

## 2021-03-23 ENCOUNTER — Encounter: Payer: Self-pay | Admitting: Internal Medicine

## 2021-03-23 VITALS — BP 126/72 | HR 69 | Temp 98.1°F | Resp 18 | Ht 71.0 in | Wt 247.4 lb

## 2021-03-23 DIAGNOSIS — J449 Chronic obstructive pulmonary disease, unspecified: Secondary | ICD-10-CM | POA: Diagnosis not present

## 2021-03-23 DIAGNOSIS — G4452 New daily persistent headache (NDPH): Secondary | ICD-10-CM | POA: Diagnosis not present

## 2021-03-23 DIAGNOSIS — I10 Essential (primary) hypertension: Secondary | ICD-10-CM

## 2021-03-23 NOTE — Progress Notes (Signed)
Subjective:    Patient ID: Colin Watson, male    DOB: [birth year], 61 y.o.   MRN: [id number]  DOS:  03/23/2021 Type of visit - description: Follow-up We discussed several issues  COPD: Did not to start Anoro, did not pursue PFTs. Still has cough and wheezing some days, no sputum production.  Has developed a headache, since around Christmas, is not daily, described as heaviness or tension at the back of the head. Last about 30 minutes after he takes an Advil. Denies dizziness, diplopia, slurred speech, motor deficits. No radiation to the shoulders. No fever chills or weight loss I asked if this was the worst headache of his life: "Never had headaches".    Also, 2-week history of sinus congestions again without fever chills.  Clear nasal discharge.  Sinus needs  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, f/u at [facility]     Nasal polyp     Past Surgical History:  Procedure Laterality Date   cyst excision,wrist      Current Outpatient Medications  Medication Instructions   albuterol (VENTOLIN HFA) 108 (90 Base) MCG/ACT inhaler SMARTSIG:2 Puff(s) By Mouth Every 4-6 Hours PRN   amLODipine (NORVASC) 10 MG tablet TAKE 1 TABLET DAILY   atorvastatin (LIPITOR) 20 mg, Oral, Daily at bedtime   azithromycin (ZITHROMAX Z-PAK) 250 MG tablet 2 tabs a day the first day, then 1 tab a day x 4 days   buPROPion (WELLBUTRIN) 100 mg, Oral, 2 times daily   carvedilol (COREG) 25 MG tablet TAKE 1 TABLET TWICE A DAY WITH MEALS   fluticasone (FLONASE) 50 MCG/ACT nasal spray 2 sprays, Each Nare, Daily   losartan-hydrochlorothiazide (HYZAAR) 100-12.5 MG tablet TAKE 1 TABLET DAILY   tadalafil (CIALIS) 20 mg, Oral, Every 48 hours PRN   umeclidinium-vilanterol (ANORO ELLIPTA) 62.5-25 MCG/INH AEPB 1 puff,  Inhalation, Daily       Objective:   Physical Exam BP 126/72 (BP Location: Left Arm, Patient Position: Sitting, Cuff Size: Normal)    Pulse 69    Temp 98.1 F (36.7 C) (Oral)    Resp 18    Ht 5\' 11"  (1.803 m)    Wt 247 lb 6 oz (112.2 kg)    SpO2 98%    BMI 34.50 kg/m  General:   Well developed, NAD, BMI noted. Neck: No TTP at the cervical, ROM wnl HEENT:  Normocephalic . Face symmetric, atraumatic. Nose: Slightly congested. Throat symmetric TMs normal Lungs:  CTA B Normal respiratory effort, no intercostal retractions, no accessory muscle use. Heart: RRR,  no murmur.  Lower extremities: no pretibial edema bilaterally  Skin: Not pale. Not jaundice Neurologic:  alert & oriented X3.  Speech normal, gait appropriate for age and unassisted.  Motor and DTR symmetric Psych--  Cognition and judgment appear intact.  Cooperative with normal attention span and concentration.  Behavior appropriate. No anxious or depressed appearing.      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  He is following a low potassium diet.  Continue amlodipine, carvedilol, Hyzaar. COPD: Mild symptoms, afraid to start Anoro, did not pursue PFTs.  Plan: Recommend to call pulmonary, schedule PFTs, consider start Anoro after PFTs. Headache: Is hard to get a straight history from the patient, to the best of my ability this is a new onset of headache, happening about 5 times a week, not severe, located at the   posterior head. He has a history of meningioma, recommend to contact neurosurgery at [place]. Allergies: Mild sinus congestion, recommend Flonase and Astepro. RTC 4 months     This visit occurred during the  SARS-CoV-2 public health emergency.  Safety protocols were in place, including screening questions prior to the visit, additional usage of staff PPE, and extensive cleaning of exam room while observing appropriate contact time as indicated for disinfecting solutions.

## 2021-03-23 NOTE — Assessment & Plan Note (Signed)
HTN well controlled, last potassium 5.3, minimally elevated.  He is following a low potassium diet.  Continue amlodipine, carvedilol, Hyzaar. COPD: Mild symptoms, afraid to start Anoro, did not pursue PFTs.  Plan: Recommend to call pulmonary, schedule PFTs, consider start Anoro after PFTs. Headache: Is hard to get a straight history from the patient, to the best of my ability this is a new onset of headache, happening about 5 times a week, not severe, located at the   posterior head. He has a history of meningioma, recommend to contact neurosurgery at [place]. Allergies: Mild sinus congestion, recommend Flonase and Astepro. RTC 4 months

## 2021-03-23 NOTE — Patient Instructions (Addendum)
Continue with low potassium diet  Call the pulmonary office schedule your pulmonary function test (PFTs).  [place] for now, we can always start it after you have the testing  Call your neurosurgeons at [place] regards the headache.  Allergies: Okay to take Allegra over-the-counter 60 mg twice daily as needed Use Flonase and Astepro, over-the-counter nasal sprays.    GO TO THE FRONT DESK, PLEASE SCHEDULE YOUR APPOINTMENTS Come back for   a checkup in 4 months

## 2021-04-02 ENCOUNTER — Other Ambulatory Visit: Payer: Self-pay

## 2021-04-02 DIAGNOSIS — Z87891 Personal history of nicotine dependence: Secondary | ICD-10-CM

## 2021-04-02 DIAGNOSIS — F17210 Nicotine dependence, cigarettes, uncomplicated: Secondary | ICD-10-CM

## 2021-04-19 ENCOUNTER — Encounter: Payer: Self-pay | Admitting: Internal Medicine

## 2021-04-25 ENCOUNTER — Encounter: Payer: Self-pay | Admitting: Acute Care

## 2021-04-25 ENCOUNTER — Other Ambulatory Visit: Payer: Self-pay | Admitting: Internal Medicine

## 2021-04-25 ENCOUNTER — Encounter: Payer: Managed Care, Other (non HMO) | Admitting: Pulmonary Disease

## 2021-04-25 ENCOUNTER — Other Ambulatory Visit: Payer: Self-pay

## 2021-04-25 ENCOUNTER — Ambulatory Visit (INDEPENDENT_AMBULATORY_CARE_PROVIDER_SITE_OTHER): Payer: Managed Care, Other (non HMO) | Admitting: Acute Care

## 2021-04-25 DIAGNOSIS — F17210 Nicotine dependence, cigarettes, uncomplicated: Secondary | ICD-10-CM | POA: Diagnosis not present

## 2021-04-25 NOTE — Patient Instructions (Signed)
Thank you for participating in the [facility] Screening [facility]. °It was our pleasure to meet you today. °We will call you with the results of your scan within the next few days. °Your scan will be assigned a Lung RADS category score by the physicians reading the scans.  °This Lung RADS score determines follow up scanning.  °See below for description of categories, and follow up screening recommendations. °We will be in touch to schedule your follow up screening annually or based on recommendations of our providers. °We will fax a copy of your scan results to your Primary Care Physician, or the physician who referred you to the program, to ensure they have the results. °Please call the office if you have any questions or concerns regarding your scanning experience or results.  °Our office number is [phone]. °Please speak with Denise Phelps, RN. She is our Lung Cancer Screening RN. °If she is unavailable when you call, please have the office staff send her a message. She will return your call at her earliest convenience. °Remember, if your scan is normal, we will scan you annually as long as you continue to meet the criteria for the program. (Age 55-77, Current smoker or smoker who has quit within the last 15 years). °If you are a smoker, remember, quitting is the single most powerful action that you can take to decrease your risk of lung cancer and other pulmonary, breathing related problems. °We know quitting is hard, and we are here to help.  °Please let us know if there is anything we can do to help you meet your goal of quitting. °If you are a former smoker, congratulations. We are proud of you! Remain smoke free! °Remember you can refer friends or family members through the number above.  °We will screen them to make sure they meet criteria for the program. °Thank you for helping us take better care of you by participating in Lung Screening. ° °You can receive free nicotine replacement therapy  ( patches, gum or mints) by calling 1-800-QUIT NOW. Please call so we can get you on the path to becoming  a non-smoker. I know it is hard, but you can do this! ° °Lung RADS Categories: ° °Lung RADS 1: no nodules or definitely non-concerning nodules.  °Recommendation is for a repeat annual scan in 12 months. ° °Lung RADS 2:  nodules that are non-concerning in appearance and behavior with a very low likelihood of becoming an active cancer. °Recommendation is for a repeat annual scan in 12 months. ° °Lung RADS 3: nodules that are probably non-concerning , includes nodules with a low likelihood of becoming an active cancer.  Recommendation is for a 6-month repeat screening scan. Often noted after an upper respiratory illness. We will be in touch to make sure you have no questions, and to schedule your 6-month scan. ° °Lung RADS 4 A: nodules with concerning findings, recommendation is most often for a follow up scan in 3 months or additional testing based on our provider's assessment of the scan. We will be in touch to make sure you have no questions and to schedule the recommended 3 month follow up scan. ° °Lung RADS 4 B:  indicates findings that are concerning. We will be in touch with you to schedule additional diagnostic testing based on our provider's  assessment of the scan. ° °Hypnosis for smoking cessation  °[employer] °[phone] ° °Acupuncture for smoking cessation  °[facility] °[phone]  °

## 2021-04-25 NOTE — Progress Notes (Signed)
Virtual Visit via Telephone Note  I connected with Colin Watson on 04/25/21 at 12:00 PM EST by telephone and verified that I am speaking with the correct person using two identifiers.  Location: Patient:  At home Provider:  [place], [place], [place], [address]    I discussed the limitations, risks, security and privacy concerns of performing an evaluation and management service by telephone and the availability of in person appointments. I also discussed with the patient that there may be a patient responsible charge related to this service. The patient expressed understanding and agreed to proceed.    Shared Decision Making Visit Lung Cancer Screening Program [phone])   Eligibility: Age 61 y.o. Pack Years Smoking History Calculation 45 pack year smoking history (# packs/per year x # years smoked) Recent History of coughing up blood  no Unexplained weight loss? no ( >Than 15 pounds within the last 6 months ) Prior History Lung / other cancer no (Diagnosis within the last 5 years already requiring surveillance chest CT Scans). Smoking Status Current Smoker Former Smokers: Years since quit:  NA  Quit Date:  NA  Visit Components: Discussion included one or more decision making aids. yes Discussion included risk/benefits of screening. yes Discussion included potential follow up diagnostic testing for abnormal scans. yes Discussion included meaning and risk of over diagnosis. yes Discussion included meaning and risk of False Positives. yes Discussion included meaning of total radiation exposure. yes  Counseling Included: Importance of adherence to annual lung cancer LDCT screening. yes Impact of comorbidities on ability to participate in the program. yes Ability and willingness to under diagnostic treatment. yes  Smoking Cessation Counseling: Current Smokers:  Discussed importance of smoking cessation. yes Information about tobacco cessation classes and  interventions provided to patient. yes Patient provided with "ticket" for LDCT Scan. yes Symptomatic Patient. no  Counseling NA Diagnosis Code: Tobacco Use Z72.0 Asymptomatic Patient yes  Counseling (Intermediate counseling: > three minutes counseling) F8101 Former Smokers:  Discussed the importance of maintaining cigarette abstinence. yes Diagnosis Code: Personal History of Nicotine Dependence. B51.025 Information about tobacco cessation classes and interventions provided to patient. Yes Patient provided with "ticket" for LDCT Scan. yes Written Order for Lung Cancer Screening with LDCT placed in Epic. Yes (CT Chest Lung Cancer Screening Low Dose W/O CM) [id number] Z12.2-Screening of respiratory organs Z87.891-Personal history of nicotine dependence  I have spent 25 minutes of face to face/ virtual visit   time with  Colin Watson discussing the risks and benefits of lung cancer screening. We viewed / discussed a power point together that explained in detail the above noted topics. We paused at intervals to allow for questions to be asked and answered to ensure understanding.We discussed that the single most powerful action that he can take to decrease his risk of developing lung cancer is to quit smoking. We discussed whether or not he is ready to commit to setting a quit date. We discussed options for tools to aid in quitting smoking including nicotine replacement therapy, non-nicotine medications, support groups, Quit Smart classes, and behavior modification. We discussed that often times setting smaller, more achievable goals, such as eliminating 1 cigarette a day for a week and then 2 cigarettes a day for a week can be helpful in slowly decreasing the number of cigarettes smoked. This allows for a sense of accomplishment as well as providing a clinical benefit. I provided  him  with smoking cessation  information  with contact information for community resources,  classes, free nicotine replacement  therapy, and access to mobile apps, text messaging, and on-line smoking cessation help. I have also provided  him  the office contact information in the event he needs to contact me, or the screening staff. We discussed the time and location of the scan, and that either Doroteo Glassman RN, Joella Prince, RN  or I will call / send a letter with the results within 24-72 hours of receiving them. The patient verbalized understanding of all of  the above and had no further questions upon leaving the office. They have my contact information in the event they have any further questions.  I spent 3 minutes counseling on smoking cessation and the health risks of continued tobacco abuse.  I explained to the patient that there has been a high incidence of coronary artery disease noted on these exams. I explained that this is a non-gated exam therefore degree or severity cannot be determined. This patient is on statin therapy. I have asked the patient to follow-up with their PCP regarding any incidental finding of coronary artery disease and management with diet or medication as their PCP  feels is clinically indicated. The patient verbalized understanding of the above and had no further questions upon completion of the visit.      Magdalen Spatz, NP 04/25/2021

## 2021-04-26 ENCOUNTER — Ambulatory Visit
Admission: RE | Admit: 2021-04-26 | Discharge: 2021-04-26 | Disposition: A | Discharge status: Home / Self Care | Payer: Managed Care, Other (non HMO) | Source: Ambulatory Visit | Attending: Acute Care | Admitting: Acute Care

## 2021-04-26 DIAGNOSIS — Z87891 Personal history of nicotine dependence: Secondary | ICD-10-CM

## 2021-04-26 DIAGNOSIS — F17210 Nicotine dependence, cigarettes, uncomplicated: Secondary | ICD-10-CM

## 2021-04-30 ENCOUNTER — Other Ambulatory Visit: Payer: Self-pay

## 2021-04-30 DIAGNOSIS — Z87891 Personal history of nicotine dependence: Secondary | ICD-10-CM

## 2021-04-30 DIAGNOSIS — F17210 Nicotine dependence, cigarettes, uncomplicated: Secondary | ICD-10-CM

## 2021-05-16 ENCOUNTER — Encounter: Payer: Self-pay | Admitting: Internal Medicine

## 2021-05-28 ENCOUNTER — Encounter: Payer: Self-pay | Admitting: Internal Medicine

## 2021-06-20 ENCOUNTER — Other Ambulatory Visit: Payer: Self-pay | Admitting: Internal Medicine

## 2021-07-04 ENCOUNTER — Encounter: Payer: Self-pay | Admitting: Family Medicine

## 2021-07-04 ENCOUNTER — Ambulatory Visit: Payer: Managed Care, Other (non HMO) | Admitting: Family Medicine

## 2021-07-04 NOTE — Patient Instructions (Incomplete)

## 2021-07-04 NOTE — Progress Notes (Deleted)
PATIENT: Colin Watson DOB: 1961-02-24  REASON FOR VISIT: follow up HISTORY FROM: patient  No chief complaint on file.    HISTORY OF PRESENT ILLNESS:  07/04/21 ALL:  Colin Watson returns for follow up for OSA on CPAP. He was last seen 12/2020 and had not used CPAP recently in the setting of COPD exacerbation and Coivd infection. Since,   01/03/2021 ALL:  Colin Watson is a 61 y.o. male here today for follow up for OSA on CPAP. He was doing well on therapy at last visit with exception of some difficulty with mask leaking as he is a side sleeper. He was able to get a new mask and now using F30. He likes it much better. Unfortunately, he has had several medical events that have prevented usage for the past few months. He is trying to quit smoking. He is down to 2-3 cigarettes a day.   He has not used CPAP in the past 90 days.   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  The patient is unaccompanied today.  I first met him at the request of his primary care physician on 12/07/2019, at which time he reported snoring and excessive daytime somnolence.  He had witnessed apneas as well.  He was advised to proceed with sleep study.  He had a home sleep test on 01/12/2020 which indicated severe obstructive sleep apnea with an AHI of 46.6/h, O2 nadir 84%. He was advised to start AutoPap therapy.  His set up date was 03/14/2020.   Today, 05/25/2020: I reviewed his AutoPap compliance data for the past 70 days from 03/14/2020 through 05/22/2020, during which time he used his machine only 8 days with percent use days greater than 4 hours at 0%.  Average AHI when he used his  machine was 2.7/h, 95th percentile of pressure at 8.6 cm with a range of 7 to 15 cm with EPR, leak on the low side with a 95th percentile at 3 L/min.  He reports that his mask is not conducive to side sleeping.  He is motivated to continue with treatment.  He would like to consider a different mask.  He also feels that the machine ramps up too quickly.  He would like to reduce the ramp time.  He feels that the pressure goes up too high.  He would like to see if we can reduce the pressure settings.   REVIEW OF SYSTEMS: Out of a complete 14 system review of symptoms, the patient complains only of the following symptoms, shortness of breath and all other reviewed systems are negative.  ESS: 6  ALLERGIES: Allergies  Allergen Reactions   Ampicillin     HOME MEDICATIONS: Outpatient Medications Prior to Visit  Medication Sig Dispense Refill   albuterol (VENTOLIN HFA) 108 (90 Base) MCG/ACT inhaler SMARTSIG:2 Puff(s) By Mouth Every 4-6 Hours PRN     amLODipine (NORVASC) 10 MG tablet TAKE 1 TABLET DAILY 90 tablet 1   atorvastatin (LIPITOR) 20 MG tablet TAKE 1 TABLET AT BEDTIME 90 tablet 1   buPROPion (WELLBUTRIN) 100 MG tablet Take 1 tablet (100 mg total) by mouth 2 (two) times daily. 60 tablet 6   carvedilol (COREG) 25 MG tablet TAKE 1 TABLET  TWICE A DAY WITH MEALS 180 tablet 1   fluticasone (FLONASE) 50 MCG/ACT nasal spray Place 2 sprays into both nostrils daily. 16 g 5   losartan-hydrochlorothiazide (HYZAAR) 100-12.5 MG tablet TAKE 1 TABLET DAILY 90 tablet 1   tadalafil (CIALIS) 20 MG tablet Take 1 tablet (20 mg total) by mouth every other day as needed for erectile dysfunction.     umeclidinium-vilanterol (ANORO ELLIPTA) 62.5-25 MCG/INH AEPB Inhale 1 puff into the lungs daily. 60 each 3   No facility-administered medications prior to visit.    PAST MEDICAL HISTORY: Past Medical History:  Diagnosis Date   Erectile dysfunction    GERD (gastroesophageal reflux disease)    Hypertension  09/23/2013   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, f/u at [facility]     Nasal polyp     PAST SURGICAL HISTORY: Past Surgical History:  Procedure Laterality Date   cyst excision,wrist      FAMILY HISTORY: Family History  Problem Relation Age of Onset   COPD Father    Bone cancer Paternal Grandmother    Diabetes Neg Hx    Heart disease Neg Hx    Colon cancer Neg Hx    Prostate cancer Neg Hx     SOCIAL HISTORY: Social History   Socioeconomic History   Marital status: Married    Spouse name: Not on file   Number of children: 2   Years of education: 16   Highest education level: Not on file  Occupational History   Occupation: Optometrist for a local Co  Tobacco Use   Smoking status: Every Day    Packs/day: 1.00    Years: 45.00    Pack years: 45.00    Types: Cigarettes   Smokeless tobacco: Current   Tobacco comments:    Down to 2-4 cigarettes daily  Vaping Use   Vaping Use: Never used  Substance and Sexual Activity   Alcohol use: Yes    Alcohol/week: 6.0 standard drinks    Types: 6 Cans of beer per week   Drug use: No   Sexual activity: Yes    Partners: Female  Other Topics Concern   Not on file  Social History Narrative   HSG, [employer] - [employer]. Married '82. 1 daughter, 1 son. Work - Optometrist    Marriage in good health      Lives at home with wife   Right handed   Caffeine: at least 4 cups/day   Social Determinants of Health   Financial Resource Strain: Not on file  Food Insecurity: Not on file  Transportation Needs: Not on file  Physical Activity: Not on file  Stress: Not on file  Social Connections: Not on file  Intimate Partner Violence: Not on file     PHYSICAL EXAM  There were no vitals filed for this visit.  There is no height or weight on file to calculate BMI.  Generalized: Well developed, in no acute distress  Cardiology: normal rate and rhythm, no murmur noted Respiratory: clear  to auscultation bilaterally  Neurological examination  Mentation: Alert oriented to time, place, history taking. Follows all commands speech and language fluent Cranial nerve II-XII: Pupils were equal round reactive to light. Extraocular movements were full, visual field were full  Motor: The motor testing reveals 5 over 5 strength of all 4 extremities. Good symmetric motor tone is noted throughout.  Gait and station: Gait is normal.    DIAGNOSTIC DATA (  LABS, IMAGING, TESTING) - I reviewed patient records, labs, notes, testing and imaging myself where available.      View : No data to display.           Lab Results  Component Value Date   WBC 9.9 12/13/2020   HGB 13.4 12/13/2020   HCT 40.2 12/13/2020   MCV 94.1 12/13/2020   PLT 333.0 12/13/2020      Component Value Date/Time   NA 132 (L) 12/13/2020 1011   K 5.2 No hemolysis seen (H) 12/13/2020 1011   CL 96 12/13/2020 1011   CO2 29 12/13/2020 1011   GLUCOSE 108 (H) 12/13/2020 1011   BUN 12 12/13/2020 1011   CREATININE 0.82 12/13/2020 1011   CREATININE 0.75 12/14/2019 0916   CALCIUM 9.5 12/13/2020 1011   PROT 6.2 12/13/2020 1011   ALBUMIN 4.2 12/13/2020 1011   AST 13 12/13/2020 1011   ALT 25 12/13/2020 1011   ALKPHOS 77 12/13/2020 1011   BILITOT 0.8 12/13/2020 1011   GFRNONAA 95.02 08/16/2008 1606   Lab Results  Component Value Date   CHOL 152 12/13/2020   HDL 63.40 12/13/2020   LDLCALC 67 12/13/2020   LDLDIRECT 110.3 12/02/2011   TRIG 109.0 12/13/2020   CHOLHDL 2 12/13/2020   Lab Results  Component Value Date   HGBA1C 5.4 12/14/2019   No results found for: VITAMINB12 Lab Results  Component Value Date   TSH 1.13 12/13/2020     ASSESSMENT AND PLAN 61 y.o. year old male  has a past medical history of Erectile dysfunction, GERD (gastroesophageal reflux disease), Hypertension (09/23/2013), Meningioma of optic nerve sheath ([place]), and Nasal polyp. here with   No diagnosis found.     MARKELLE NAJARIAN has  not used CPAP since last being seen. He contributed this to COPD exacerbation and Covid 19 infection. He is motivated to restart CPAP. He was encouraged to use CPAP nightly and for greater than 4 hours each night. Risks of untreated sleep apnea review and education materials provided. He was encouraged to continue working on smoking cessation. Healthy lifestyle habits encouraged. He will follow up in 6 months, sooner if needed. He verbalizes understanding and agreement with this plan.    No orders of the defined types were placed in this encounter.     No orders of the defined types were placed in this encounter.      Debbora Presto, FNP-C 07/04/2021, 7:42 AM [facility] [address] [place], [place] [address] [phone]

## 2021-07-18 ENCOUNTER — Ambulatory Visit: Payer: Managed Care, Other (non HMO) | Admitting: Internal Medicine

## 2021-07-18 ENCOUNTER — Encounter: Payer: Self-pay | Admitting: Internal Medicine

## 2021-07-26 ENCOUNTER — Other Ambulatory Visit: Payer: Self-pay | Admitting: Internal Medicine

## 2021-07-30 ENCOUNTER — Ambulatory Visit
Admission: RE | Admit: 2021-07-30 | Discharge: 2021-07-30 | Disposition: A | Discharge status: Home / Self Care | Payer: Managed Care, Other (non HMO) | Source: Ambulatory Visit | Attending: Urgent Care | Admitting: Urgent Care

## 2021-07-30 VITALS — BP 138/80 | HR 79 | Temp 98.6°F | Resp 20

## 2021-07-30 DIAGNOSIS — L249 Irritant contact dermatitis, unspecified cause: Secondary | ICD-10-CM | POA: Diagnosis not present

## 2021-07-30 MED ORDER — PREDNISONE 20 MG PO TABS: ORAL_TABLET | ORAL | 0 refills | Status: DC

## 2021-07-30 MED ORDER — HYDROXYZINE HCL 25 MG PO TABS: 12.5000 mg | ORAL_TABLET | Freq: Three times a day (TID) | ORAL | 0 refills | Status: DC | PRN

## 2021-07-30 NOTE — Discharge Instructions (Addendum)
Stop Cerave, switch to Aquaphor. We will use an oral prednisone course to calm down irritant dermatitis.

## 2021-07-30 NOTE — ED Provider Notes (Signed)
[place]   MRN: [id number] DOB: [birth year]  Subjective:   Colin Watson is a 61 y.o. male presenting for 2-week history of persistent itchy red rash over his forearms, scalp.  Symptoms started after he went golfing and forgot to apply some burn.  This bothered him for about a day and he started using a moisturizing lotion that had anti-itch ingredients in it.  He has used it applying it specifically to the areas that have a rash.  Denies any tenderness, drainage of pus or bleeding, fevers.  Drinks about 32 ounces of water a day.  No current facility-administered medications for this encounter.  Current Outpatient Medications:    albuterol (VENTOLIN HFA) 108 (90 Base) MCG/ACT inhaler, SMARTSIG:2 Puff(s) By Mouth Every 4-6 Hours PRN, Disp: , Rfl:    amLODipine (NORVASC) 10 MG tablet, TAKE 1 TABLET DAILY, Disp: 90 tablet, Rfl: 1   atorvastatin (LIPITOR) 20 MG tablet, TAKE 1 TABLET AT BEDTIME, Disp: 90 tablet, Rfl: 1   buPROPion (WELLBUTRIN) 100 MG tablet, TAKE ONE TABLET BY MOUTH TWICE DAILY, Disp: 60 tablet, Rfl: 0   carvedilol (COREG) 25 MG tablet, TAKE 1 TABLET TWICE A DAY WITH MEALS, Disp: 180 tablet, Rfl: 1   fluticasone (FLONASE) 50 MCG/ACT nasal spray, Place 2 sprays into both nostrils daily., Disp: 16 g, Rfl: 5   losartan-hydrochlorothiazide (HYZAAR) 100-12.5 MG tablet, TAKE 1 TABLET DAILY, Disp: 90 tablet, Rfl: 1   tadalafil (CIALIS) 20 MG tablet, Take 1 tablet (20 mg total) by mouth every other day as needed for erectile dysfunction., Disp: , Rfl:    umeclidinium-vilanterol (ANORO ELLIPTA) 62.5-25 MCG/INH AEPB, Inhale 1 puff into the lungs daily., Disp: 53 each, Rfl: 3   Allergies  Allergen Reactions   Ampicillin     Past Medical History:  Diagnosis Date   Erectile dysfunction    GERD (gastroesophageal reflux disease)    Hypertension 09/23/2013   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, f/u at [facility]     Nasal  polyp      Past Surgical History:  Procedure Laterality Date   cyst excision,wrist      Family History  Problem Relation Age of Onset   COPD Father    Bone cancer Paternal Grandmother    Diabetes Neg Hx    Heart disease Neg Hx    Colon cancer Neg Hx    Prostate cancer Neg Hx     Social History   Tobacco Use   Smoking status: Every Day    Packs/day: 1.00    Years: 45.00    Pack years: 45.00    Types: Cigarettes   Smokeless tobacco: Current   Tobacco comments:    Down to 2-4 cigarettes daily  Vaping Use   Vaping Use: Never used  Substance Use Topics   Alcohol use: Yes    Alcohol/week: 6.0 standard drinks    Types: 6 Cans of beer per week   Drug use: No    ROS   Objective:   Vitals: BP 138/80   Pulse 79   Temp 98.6 F (37 C)   Resp 20   SpO2 98%   Physical Exam Constitutional:      General: He is not in acute distress.    Appearance: Normal appearance. He is well-developed and normal weight. He is not ill-appearing, toxic-appearing or diaphoretic.  HENT:     Head: Normocephalic and atraumatic.  Right Ear: External ear normal.     Left Ear: External ear normal.     Nose: Nose normal.     Mouth/Throat:     Pharynx: Oropharynx is clear.  Eyes:     General: No scleral icterus.       Right eye: No discharge.        Left eye: No discharge.     Extraocular Movements: Extraocular movements intact.  Cardiovascular:     Rate and Rhythm: Normal rate.  Pulmonary:     Effort: Pulmonary effort is normal.  Musculoskeletal:     Cervical back: Normal range of motion.  Skin:    Findings: Rash (macular papular rash over dorsal aspect of forearms, crown of his scalp and neck area) present.  Neurological:     Mental Status: He is alert and oriented to person, place, and time.  Psychiatric:        Mood and Affect: Mood normal.        Behavior: Behavior normal.        Thought Content: Thought content normal.        Judgment: Judgment normal.           Assessment and Plan :   PDMP not reviewed this encounter.  1. Irritant dermatitis    I do not suspect that this is a problem related to sunburn or photodermatitis.  Patient is likely having an irritant dermatitis from the active ingredients in the anti-itch lotion he is using.  I advised that he stop this and given the extent of his rash recommended a oral prednisone course.  Use an emollient like Aquaphor.  Hydrate well.  Counseled on general skin care and maintenance.  Counseled patient on potential for adverse effects with medications prescribed/recommended today, ER and return-to-clinic precautions discussed, patient verbalized understanding.    Jaynee Eagles, [place] 07/30/21 1617

## 2021-07-30 NOTE — ED Triage Notes (Signed)
Pt here with itchy rash to upper body and head since having sunburn 2 weeks ago. Has tried creams OTC without relief.

## 2021-08-24 ENCOUNTER — Ambulatory Visit: Payer: Managed Care, Other (non HMO) | Admitting: Podiatry

## 2021-08-24 ENCOUNTER — Encounter: Payer: Self-pay | Admitting: Podiatry

## 2021-08-24 DIAGNOSIS — L600 Ingrowing nail: Secondary | ICD-10-CM | POA: Diagnosis not present

## 2021-08-24 NOTE — Patient Instructions (Signed)

## 2021-08-27 NOTE — Progress Notes (Signed)
Subjective:   Patient ID: Colin Watson, male   DOB: 61 y.o.   MRN: [id number]   HPI Patient presents concerned about his nails stating that they are getting thicker and difficult to cut and there is some that are ingrown and make it hard for him to wear shoe gear comfortably.  Patient states this has been ongoing and becoming more of an issue for him neuro   ROS      Objective:  Physical Exam  Neurovascular status intact with thick ingrown toenail deformity of the hallux nail second nails with all nails being dystrophic and moderately tender.  Patient has good digital perfusion well oriented x3     Assessment:  Abnormal structure to the nailbeds with thickness and dystrophic changes with moderate fungal component that is also present.  Patient does have pain associated with several of these nails F2 H&P spent a great deal time going over her condition and possible treatments that are available.  At this point debridement would probably be his best alternative and I did discuss permanent removal of nails but it would require a permanent procedure.  I educated him on this and all treatments and he had all questions answered and will most likely reappoint for permanent procedure in future and we will just do pedicures for the current time       Plan:  Everything listed of top no other concerns

## 2021-08-30 ENCOUNTER — Other Ambulatory Visit: Payer: Self-pay | Admitting: Internal Medicine

## 2021-08-30 DIAGNOSIS — I10 Essential (primary) hypertension: Secondary | ICD-10-CM

## 2021-09-21 ENCOUNTER — Other Ambulatory Visit: Payer: Self-pay | Admitting: Internal Medicine

## 2021-11-02 ENCOUNTER — Encounter: Payer: Self-pay | Admitting: Internal Medicine

## 2021-11-28 ENCOUNTER — Other Ambulatory Visit: Payer: Self-pay | Admitting: Internal Medicine

## 2021-11-28 DIAGNOSIS — I10 Essential (primary) hypertension: Secondary | ICD-10-CM

## 2021-11-29 ENCOUNTER — Other Ambulatory Visit: Payer: Self-pay | Admitting: Internal Medicine

## 2021-12-30 IMAGING — CT CT CHEST LUNG CANCER SCREENING LOW DOSE W/O CM
1 of 2 series · 10 of 20 positions shown, 13 images · non-contrast
Comparison: Low-dose lung cancer screening chest CT 09/30/2017.

CLINICAL DATA: 59-year-old male current smoker with 37 pack-year
history of smoking. Lung cancer screening examination.

EXAM:
CT CHEST WITHOUT CONTRAST LOW-DOSE FOR LUNG CANCER SCREENING
TECHNIQUE: Multidetector CT imaging of the chest was performed following the
standard protocol without IV contrast.

[ct lung segmentation data · axial · 0.77mm/px · z∈[-408,-408]mm · 10 of 367 frames shown]
[frame 1/367  mediastinal]
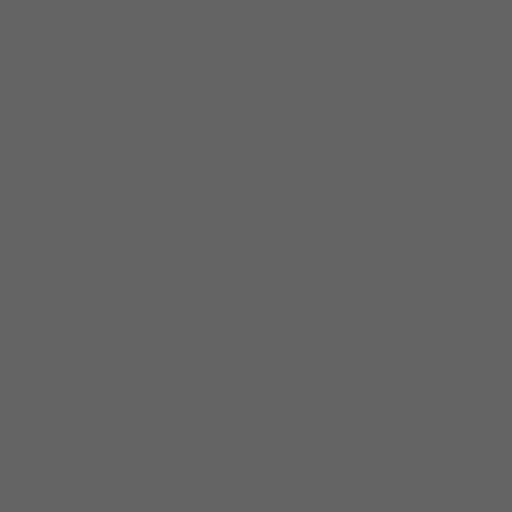
[frame 1/367  lung]
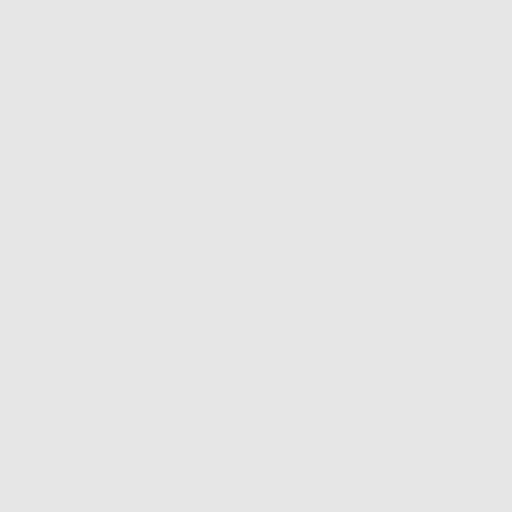
[frame 41/367  lung]
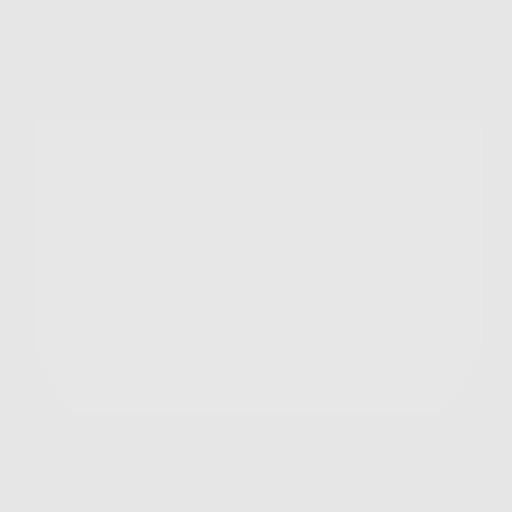
[frame 82/367  lung]
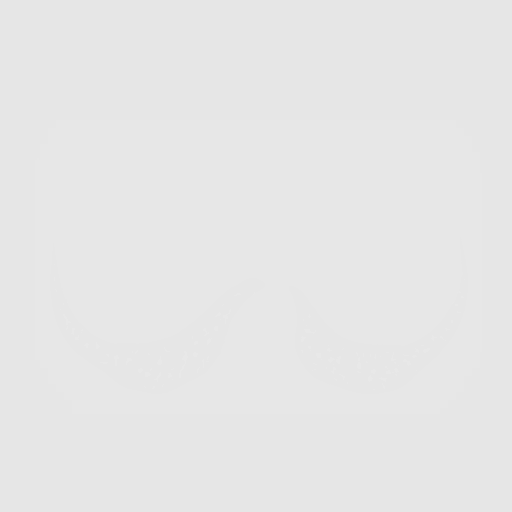
[frame 123/367  lung]
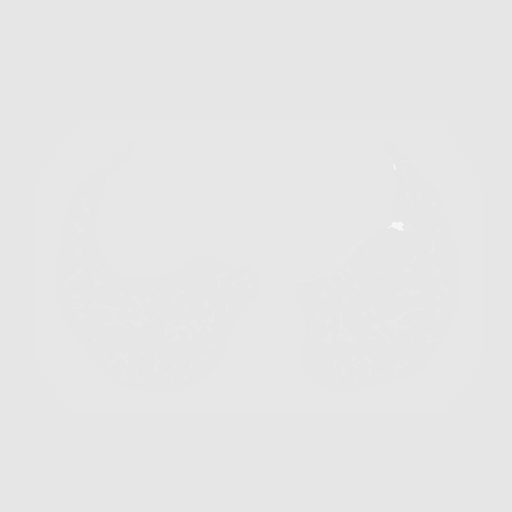
[frame 163/367  mediastinal]
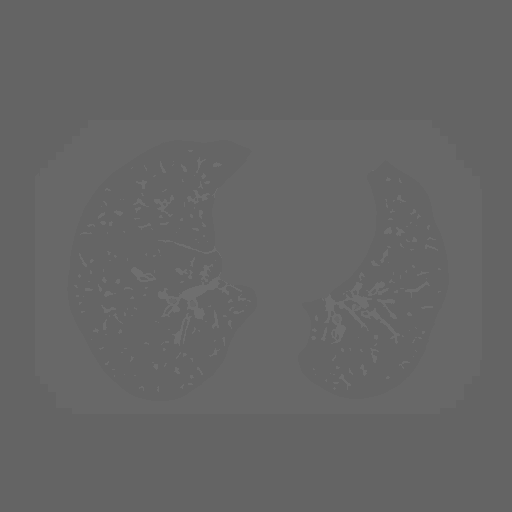
[frame 163/367  lung]
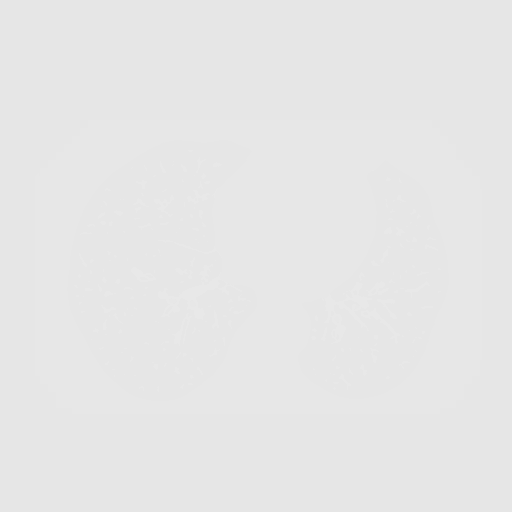
[frame 204/367  lung]
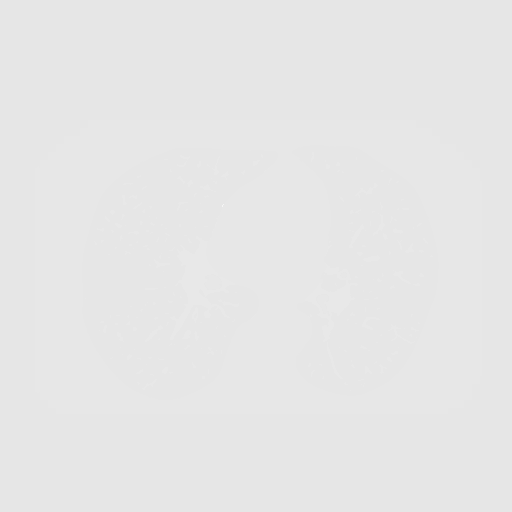
[frame 245/367  lung]
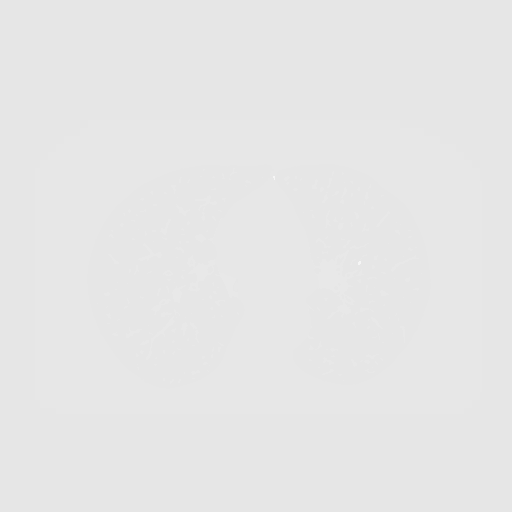
[frame 285/367  lung]
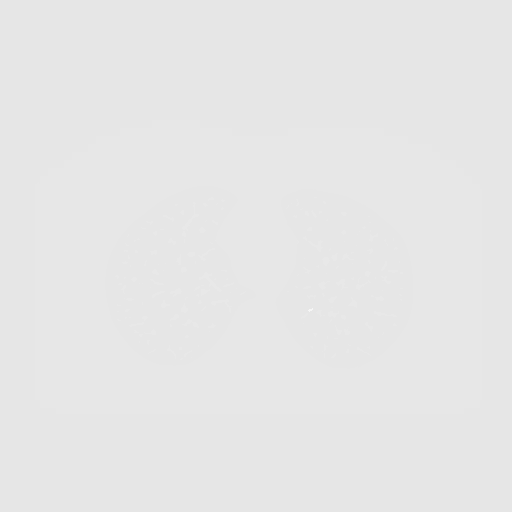
[frame 326/367  mediastinal]
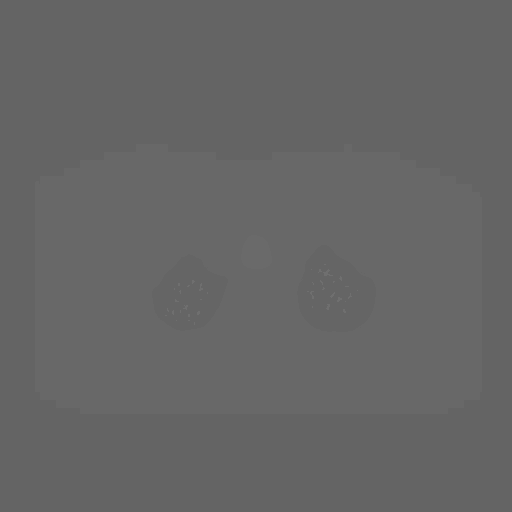
[frame 326/367  lung]
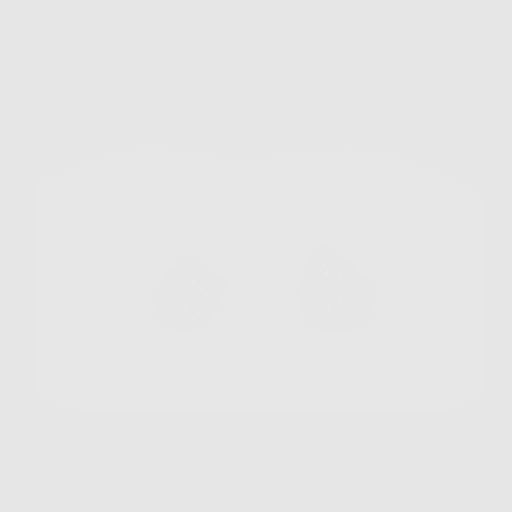
[frame 367/367  lung]
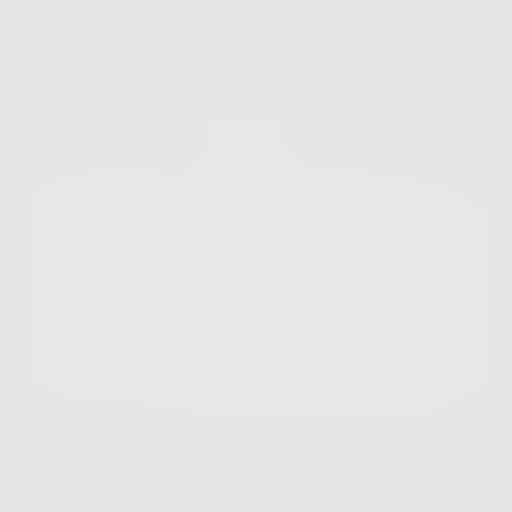

[10 of 20 positions shown; findings below may reference images not displayed]

FINDINGS: Cardiovascular: Heart size is normal. There is no significant
pericardial fluid, thickening or pericardial calcification. There is
aortic atherosclerosis, as well as atherosclerosis of the great
vessels of the mediastinum and the coronary arteries, including
calcified atherosclerotic plaque in the left main, left anterior
descending and right coronary arteries.

Mediastinum/Nodes: No pathologically enlarged mediastinal or hilar
lymph nodes. Please note that accurate exclusion of hilar adenopathy
is limited on noncontrast CT scans. Esophagus is unremarkable in
appearance. No axillary lymphadenopathy.

Lungs/Pleura: Small pulmonary nodules are noted in the lungs
bilaterally, largest of which is in the posterior aspect of the
right lower lobe (axial image 217 of series 3), with a volume
derived mean diameter of 2.6 mm. No other larger more suspicious
appearing pulmonary nodules or masses are noted. No acute
consolidative airspace disease. No pleural effusions. Mild diffuse
bronchial wall thickening with very mild centrilobular and
paraseptal emphysema.

Upper Abdomen: Unremarkable.

Musculoskeletal: There are no aggressive appearing lytic or blastic
lesions noted in the visualized portions of the skeleton.
IMPRESSION: 1. Lung-RADS 2S, benign appearance or behavior. Continue annual
screening with low-dose chest CT without contrast in 12 months.
2. The "S" modifier above refers to potentially clinically
significant non lung cancer related findings. Specifically, there is
aortic atherosclerosis, in addition to left main and 2 vessel
coronary artery disease. Please note that although the presence of
coronary artery calcium documents the presence of coronary artery
disease, the severity of this disease and any potential stenosis
cannot be assessed on this non-gated CT examination. Assessment for
potential risk factor modification, dietary therapy or pharmacologic
therapy may be warranted, if clinically indicated.
3. Mild diffuse bronchial wall thickening with very mild
centrilobular and paraseptal emphysema; imaging findings suggestive
of underlying COPD.

Aortic Atherosclerosis (3ZOOB-JXW.W) and Emphysema (3ZOOB-LKK.N).

## 2022-01-01 ENCOUNTER — Ambulatory Visit
Admission: RE | Admit: 2022-01-01 | Discharge: 2022-01-01 | Disposition: A | Discharge status: Home / Self Care | Payer: Managed Care, Other (non HMO) | Source: Ambulatory Visit | Attending: Urgent Care | Admitting: Urgent Care

## 2022-01-01 VITALS — BP 145/78 | HR 90 | Temp 97.6°F | Resp 18

## 2022-01-01 DIAGNOSIS — J438 Other emphysema: Secondary | ICD-10-CM

## 2022-01-01 DIAGNOSIS — J018 Other acute sinusitis: Secondary | ICD-10-CM

## 2022-01-01 DIAGNOSIS — F17200 Nicotine dependence, unspecified, uncomplicated: Secondary | ICD-10-CM

## 2022-01-01 DIAGNOSIS — J309 Allergic rhinitis, unspecified: Secondary | ICD-10-CM

## 2022-01-01 MED ORDER — PREDNISONE 50 MG PO TABS: 50.0000 mg | ORAL_TABLET | Freq: Every day | ORAL | 0 refills | Status: DC

## 2022-01-01 MED ORDER — AMOXICILLIN-POT CLAVULANATE 875-125 MG PO TABS: 1.0000 | ORAL_TABLET | Freq: Two times a day (BID) | ORAL | 0 refills | Status: DC

## 2022-01-01 MED ORDER — PROMETHAZINE-DM 6.25-15 MG/5ML PO SYRP: 5.0000 mL | ORAL_SOLUTION | Freq: Three times a day (TID) | ORAL | 0 refills | Status: DC | PRN

## 2022-01-01 MED ORDER — ALBUTEROL SULFATE HFA 108 (90 BASE) MCG/ACT IN AERS: 1.0000 | INHALATION_SPRAY | Freq: Four times a day (QID) | RESPIRATORY_TRACT | 0 refills | Status: DC | PRN

## 2022-01-01 NOTE — ED Triage Notes (Signed)
Patient presents to UC for cough and runny nose since 1 week. Worse yesterday. Taking mucinex, advil,  and his daily flonase.

## 2022-01-01 NOTE — ED Provider Notes (Signed)
[facility]  Note:  This document was prepared using Systems analyst and may include unintentional dictation errors.  MRN: [id number] DOB: Jul 20, 1960  Subjective:   Colin Watson is a 61 y.o. male presenting for 1 week history of acute onset runny and stuffy nose, sinus drainage, chest tightness, symptoms became severe last night with the coughing. Has used Mucinex, Flonase. He is still smoking, needs a refill on his inhaler. Regarding medication allergies, he has taken amoxicillin without any issues.   No current facility-administered medications for this encounter.  Current Outpatient Medications:    albuterol (VENTOLIN HFA) 108 (90 Base) MCG/ACT inhaler, SMARTSIG:2 Puff(s) By Mouth Every 4-6 Hours PRN, Disp: , Rfl:    amLODipine (NORVASC) 10 MG tablet, TAKE 1 TABLET DAILY, Disp: 90 tablet, Rfl: 1   atorvastatin (LIPITOR) 20 MG tablet, TAKE 1 TABLET AT BEDTIME, Disp: 90 tablet, Rfl: 1   buPROPion (WELLBUTRIN) 100 MG tablet, TAKE ONE TABLET BY MOUTH TWICE DAILY, Disp: 60 tablet, Rfl: 0   carvedilol (COREG) 25 MG tablet, Take 1 tablet (25 mg total) by mouth 2 (two) times daily with a meal., Disp: 180 tablet, Rfl: 0   fluticasone (FLONASE) 50 MCG/ACT nasal spray, Place 2 sprays into both nostrils daily., Disp: 16 g, Rfl: 5   hydrOXYzine (ATARAX) 25 MG tablet, Take 0.5-1 tablets (12.5-25 mg total) by mouth every 8 (eight) hours as needed for itching., Disp: 30 tablet, Rfl: 0   losartan-hydrochlorothiazide (HYZAAR) 100-12.5 MG tablet, TAKE 1 TABLET DAILY, Disp: 90 tablet, Rfl: 1   tadalafil (CIALIS) 20 MG tablet, Take 1 tablet (20 mg total) by mouth every other day as needed for erectile dysfunction., Disp: , Rfl:    umeclidinium-vilanterol (ANORO ELLIPTA) 62.5-25 MCG/INH AEPB, Inhale 1 puff into the lungs daily., Disp: 67 each, Rfl: 3   Allergies  Allergen Reactions   Ampicillin     Past Medical History:  Diagnosis Date   Erectile dysfunction     GERD (gastroesophageal reflux disease)    Hypertension 09/23/2013   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, f/u at [facility]     Nasal polyp      Past Surgical History:  Procedure Laterality Date   cyst excision,wrist      Family History  Problem Relation Age of Onset   COPD Father    Bone cancer Paternal Grandmother    Diabetes Neg Hx    Heart disease Neg Hx    Colon cancer Neg Hx    Prostate cancer Neg Hx     Social History   Tobacco Use   Smoking status: Every Day    Packs/day: 1.00    Years: 45.00    Total pack years: 45.00    Types: Cigarettes   Smokeless tobacco: Current   Tobacco comments:    Down to 2-4 cigarettes daily  Vaping Use   Vaping Use: Never used  Substance Use Topics   Alcohol use: Yes    Alcohol/week: 6.0 standard drinks of alcohol    Types: 6 Cans of beer per week   Drug use: No    ROS   Objective:   Vitals: BP (!) 145/78 (BP Location: Left Arm)   Pulse 90   Temp 97.6 F (36.4 C) (Oral)   Resp 18   SpO2 96%   Physical Exam Constitutional:      General: He is not in acute distress.    Appearance:  Normal appearance. He is well-developed and normal weight. He is not ill-appearing, toxic-appearing or diaphoretic.  HENT:     Head: Normocephalic and atraumatic.     Right Ear: Tympanic membrane, ear canal and external ear normal. No drainage, swelling or tenderness. No middle ear effusion. There is no impacted cerumen. Tympanic membrane is not erythematous or bulging.     Left Ear: Tympanic membrane, ear canal and external ear normal. No drainage, swelling or tenderness.  No middle ear effusion. There is no impacted cerumen. Tympanic membrane is not erythematous or bulging.     Nose: Congestion and rhinorrhea present.     Mouth/Throat:     Mouth: Mucous membranes are moist.     Pharynx: No oropharyngeal exudate or posterior oropharyngeal erythema.  Eyes:     General: No scleral icterus.       Right  eye: No discharge.        Left eye: No discharge.     Extraocular Movements: Extraocular movements intact.     Conjunctiva/sclera: Conjunctivae normal.  Cardiovascular:     Rate and Rhythm: Normal rate and regular rhythm.     Heart sounds: Normal heart sounds. No murmur heard.    No friction rub. No gallop.  Pulmonary:     Effort: Pulmonary effort is normal. No respiratory distress.     Breath sounds: Normal breath sounds. No stridor. No wheezing, rhonchi or rales.  Musculoskeletal:     Cervical back: Normal range of motion and neck supple. No rigidity. No muscular tenderness.  Neurological:     General: No focal deficit present.     Mental Status: He is alert and oriented to person, place, and time.  Psychiatric:        Mood and Affect: Mood normal.        Behavior: Behavior normal.        Thought Content: Thought content normal.     Assessment and Plan :   PDMP not reviewed this encounter.  1. Acute non-recurrent sinusitis of other sinus   2. Allergic rhinitis, unspecified seasonality, unspecified trigger   3. Smoker   4. Other emphysema ([place])    Will start empiric treatment for sinusitis with Augmentin.  Recommended prednisone in the context of his emphysema, smoking. Deferred imaging given clear cardiopulmonary exam, hemodynamically stable vital signs.  Counseled patient on potential for adverse effects with medications prescribed/recommended today, ER and return-to-clinic precautions discussed, patient verbalized understanding.    Jaynee Eagles, [place] 01/01/22 1445

## 2022-01-08 ENCOUNTER — Ambulatory Visit: Payer: Managed Care, Other (non HMO) | Admitting: Internal Medicine

## 2022-01-08 ENCOUNTER — Encounter: Payer: Self-pay | Admitting: Internal Medicine

## 2022-01-08 VITALS — BP 130/80 | HR 81 | Temp 97.9°F | Resp 16 | Ht 71.0 in | Wt 251.2 lb

## 2022-01-08 DIAGNOSIS — J441 Chronic obstructive pulmonary disease with (acute) exacerbation: Secondary | ICD-10-CM | POA: Diagnosis not present

## 2022-01-08 DIAGNOSIS — E782 Mixed hyperlipidemia: Secondary | ICD-10-CM

## 2022-01-08 DIAGNOSIS — K635 Polyp of colon: Secondary | ICD-10-CM | POA: Diagnosis not present

## 2022-01-08 DIAGNOSIS — I10 Essential (primary) hypertension: Secondary | ICD-10-CM | POA: Diagnosis not present

## 2022-01-08 LAB — COMPREHENSIVE METABOLIC PANEL
ALT: 26 U/L (ref 0–53)
AST: 13 U/L (ref 0–37)
Albumin: 4.1 g/dL (ref 3.5–5.2)
Alkaline Phosphatase: 87 U/L (ref 39–117)
BUN: 11 mg/dL (ref 6–23)
CO2: 30 mEq/L (ref 19–32)
Calcium: 9.8 mg/dL (ref 8.4–10.5)
Chloride: 98 mEq/L (ref 96–112)
Creatinine, Ser: 0.76 mg/dL (ref 0.40–1.50)
GFR: 95.89 mL/min (ref >60.00)
Glucose, Bld: 115 mg/dL — ABNORMAL HIGH (ref 70–99)
Potassium: 5.2 mEq/L — ABNORMAL HIGH (ref 3.5–5.1)
Sodium: 135 mEq/L (ref 135–145)
Total Bilirubin: 0.6 mg/dL (ref 0.2–1.2)
Total Protein: 6.5 g/dL (ref 6.0–8.3)

## 2022-01-08 LAB — LIPID PANEL
Cholesterol: 190 mg/dL (ref 0–200)
HDL: 68.5 mg/dL (ref >39.00)
LDL Cholesterol: 92 mg/dL (ref 0–99)
NonHDL: 121.31
Total CHOL/HDL Ratio: 3
Triglycerides: 148 mg/dL (ref 0.0–149.0)
VLDL: 29.6 mg/dL (ref 0.0–40.0)

## 2022-01-08 MED ORDER — PREDNISONE 10 MG PO TABS: ORAL_TABLET | ORAL | 0 refills | Status: DC

## 2022-01-08 MED ORDER — LOSARTAN POTASSIUM-HCTZ 100-12.5 MG PO TABS: 1.0000 | ORAL_TABLET | Freq: Every day | ORAL | 0 refills | Status: DC

## 2022-01-08 MED ORDER — AMLODIPINE BESYLATE 10 MG PO TABS: 10.0000 mg | ORAL_TABLET | Freq: Every day | ORAL | 0 refills | Status: DC

## 2022-01-08 NOTE — Assessment & Plan Note (Signed)
HTN: BP at home are typically okay when checked, BP today satisfactory, RF amlodipine, Hyzaar, continue carvedilol.  Check a CMP. Hyperlipidemia: Was having aches and pains with atorvastatin, self decrease dose to 1 tablet every 2 to 3 days.  He is not sure that atorvastatin was the cause of pains, he understand that at age 61 he is bound to have some aches. We agreed on stop atorvastatin, recheck FLP, consider a different statin. COPD exacerbation:  Ampicillin is listed as an allergy, but was seen elsewhere, Rx  Augmentin and tolerated well but I asked him to stop it anyway. Since he started symptoms restarted Anoro-albuterol, rec to continue. Change Mucinex D to Mucinex DM. Second round of prednisone Call if not gradually better. History of meningioma: Was seen at this office January 2023 with a headache, subsequently saw neurosurgery, had an MRI, he was felt to be stable. Preventive care: Rec Flu shot once she is better from a COPD exacerbation. Declines COVID-vaccine CCS: Reluctant to have a colonoscopy, one of his friends had a perforation during a routine colonoscopy.  Advised that although perforations may happen, it is a extremely rare complication. d/t h/o previous polyps there is no better option than redo colonoscopy.  Patient states will call GI. RTC CPX to 3 months

## 2022-01-08 NOTE — Patient Instructions (Addendum)
For your respiratory problems: Anoro daily Albuterol as needed for cough and wheezing Change Mucinex D to Mucinex DM For nasal congestion, try Astepro nasal spray over-the-counter: 2 sprays on each side of the nose daily until better I will send another round of prednisone for you.   Proceed with a flu shot once you feel better.  Check the  blood pressure regularly BP GOAL is between 110/65 and  135/85. If it is consistently higher or lower, let me know      GO TO THE LAB : Get the blood work     [place], [place] back for   physical exam in 2 to 3 months

## 2022-01-08 NOTE — Progress Notes (Signed)
Subjective:    Patient ID: Colin Watson, male    DOB: 07/28/1960, 61 y.o.   MRN: [id number]  DOS:  01/08/2022 Type of visit - description: Follow-up  High cholesterol: Thinks statins are  causing aches and pains, decrease atorvastatin to maybe 2 or 3 times a week, aches better? He is not sure HTN: Ambulatory BPs typically normal, has not checked in a while. Developed URI, was seen at another office few days ago, Rx antibiotics, prednisone.  Feeling way better but is not completely back to normal. Currently with no fever or chills, still has some cough and occasional wheezing.  No nausea vomiting.  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, f/u at [facility]     Nasal polyp     Past Surgical History:  Procedure Laterality Date   cyst excision,wrist      Current Outpatient Medications  Medication Instructions   albuterol (VENTOLIN HFA) 108 (90 Base) MCG/ACT inhaler 1-2 puffs, Inhalation, Every 6 hours PRN   amLODipine (NORVASC) 10 MG tablet TAKE 1 TABLET DAILY   amoxicillin-clavulanate (AUGMENTIN) 875-125 MG tablet 1 tablet, Oral, 2 times daily   atorvastatin (LIPITOR) 20 MG tablet TAKE 1 TABLET AT BEDTIME   benzonatate (TESSALON) 200 MG capsule benzonatate Take 1 Capsule (oral) 3 times per day PRN for 7 days (as needed for cough) [id number] capsule 3 times per day oral 7 days active 200 MG   buPROPion (WELLBUTRIN) 100 MG tablet TAKE ONE TABLET BY MOUTH TWICE DAILY   carvedilol (COREG) 25 mg, Oral, 2 times daily with meals   fluticasone (FLONASE) 50 MCG/ACT nasal spray 2 sprays, Each Nare, Daily   hydrOXYzine (ATARAX) 12.5-25 mg, Oral, Every 8 hours PRN   losartan-hydrochlorothiazide (HYZAAR) 100-12.5 MG tablet TAKE 1 TABLET DAILY   promethazine-dextromethorphan (PROMETHAZINE-DM)  6.25-15 MG/5ML syrup 5 mLs, Oral, 3 times daily PRN   tadalafil (CIALIS) 20 mg, Oral, Every 48 hours PRN   umeclidinium-vilanterol (ANORO ELLIPTA) 62.5-25 MCG/INH AEPB 1 puff, Inhalation, Daily       Objective:   Physical Exam BP 130/80 (BP Location: Right Arm, Cuff Size: Large)   Pulse 81   Temp 97.9 F (36.6 C) (Oral)   Resp 16   Ht '5\' 11"'$  (1.803 m)   Wt 251 lb 3.2 oz (113.9 kg)   SpO2 95%   BMI 35.04 kg/m  General:   Well developed, NAD, BMI noted. HEENT:  Normocephalic . Face symmetric, atraumatic TMs: Slightly bulged but not red.  Nose: Congested Lungs:  Few rhonchi, increased expiratory time. Normal respiratory effort, no intercostal retractions, no accessory muscle use. Heart: RRR,  no murmur.  Lower extremities: no pretibial edema bilaterally  Skin: Not pale. Not jaundice Neurologic:  alert & oriented X3.  Speech normal, gait appropriate for age and unassisted Psych--  Cognition and judgment appear intact.  Cooperative with normal attention span and concentration.  Behavior appropriate. No anxious or depressed appearing.      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  Check a CMP. Hyperlipidemia: Was having aches and pains with atorvastatin, self decrease dose to 1 tablet every 2 to 3 days.  He is not sure that atorvastatin was the cause of pains, he understand that at age 12 he is bound to have some aches. We agreed on stop atorvastatin, recheck FLP, consider a different statin. COPD exacerbation:  Ampicillin is listed as an allergy, but was seen elsewhere, Rx  Augmentin and tolerated well but I asked him to stop it  anyway. Since he started symptoms restarted Anoro-albuterol, rec to continue. Change Mucinex D to Mucinex DM. Second round of prednisone Call if not gradually better. History of meningioma: Was seen at this office January 2023 with a headache, subsequently saw neurosurgery, had an MRI, he was felt to be stable. Preventive care: Rec Flu shot once she is better from a COPD exacerbation. Declines COVID-vaccine CCS: Reluctant to have a colonoscopy, one of his friends had a perforation during a routine colonoscopy.  Advised that although perforations may happen, it is a extremely rare complication. d/t h/o previous polyps there is no better option than redo colonoscopy.  Patient states will call GI. RTC CPX to 3 months

## 2022-01-10 MED ORDER — PRAVASTATIN SODIUM 20 MG PO TABS: 20.0000 mg | ORAL_TABLET | Freq: Every day | ORAL | 1 refills | Status: DC

## 2022-01-10 NOTE — Addendum Note (Signed)
Addended byDamita Dunnings D on: 01/10/2022 08:23 AM   Modules accepted: Orders

## 2022-01-15 ENCOUNTER — Ambulatory Visit: Payer: Managed Care, Other (non HMO) | Admitting: Internal Medicine

## 2022-01-23 ENCOUNTER — Encounter: Payer: Self-pay | Admitting: Internal Medicine

## 2022-01-23 DIAGNOSIS — I10 Essential (primary) hypertension: Secondary | ICD-10-CM

## 2022-01-23 MED ORDER — PRAVASTATIN SODIUM 20 MG PO TABS: 20.0000 mg | ORAL_TABLET | Freq: Every day | ORAL | 1 refills | Status: DC

## 2022-01-23 MED ORDER — BUPROPION HCL 100 MG PO TABS: 100.0000 mg | ORAL_TABLET | Freq: Two times a day (BID) | ORAL | 1 refills | Status: DC

## 2022-01-23 MED ORDER — LOSARTAN POTASSIUM-HCTZ 100-12.5 MG PO TABS: 1.0000 | ORAL_TABLET | Freq: Every day | ORAL | 1 refills | Status: DC

## 2022-01-23 MED ORDER — CARVEDILOL 25 MG PO TABS: 25.0000 mg | ORAL_TABLET | Freq: Two times a day (BID) | ORAL | 1 refills | Status: DC

## 2022-01-23 MED ORDER — AMLODIPINE BESYLATE 10 MG PO TABS: 10.0000 mg | ORAL_TABLET | Freq: Every day | ORAL | 1 refills | Status: DC

## 2022-01-23 MED ORDER — UMECLIDINIUM-VILANTEROL 62.5-25 MCG/ACT IN AEPB: 1.0000 | INHALATION_SPRAY | Freq: Every day | RESPIRATORY_TRACT | 1 refills | Status: DC

## 2022-02-04 ENCOUNTER — Other Ambulatory Visit: Payer: Self-pay | Admitting: Internal Medicine

## 2022-04-08 ENCOUNTER — Encounter: Payer: Self-pay | Admitting: Internal Medicine

## 2022-04-08 ENCOUNTER — Ambulatory Visit: Payer: Managed Care, Other (non HMO) | Admitting: Internal Medicine

## 2022-04-08 VITALS — BP 126/76 | HR 81 | Temp 98.0°F | Resp 16 | Ht 71.0 in | Wt 250.2 lb

## 2022-04-08 DIAGNOSIS — Z0001 Encounter for general adult medical examination with abnormal findings: Secondary | ICD-10-CM | POA: Diagnosis not present

## 2022-04-08 DIAGNOSIS — J441 Chronic obstructive pulmonary disease with (acute) exacerbation: Secondary | ICD-10-CM

## 2022-04-08 DIAGNOSIS — E782 Mixed hyperlipidemia: Secondary | ICD-10-CM | POA: Diagnosis not present

## 2022-04-08 DIAGNOSIS — Z0000 Encounter for general adult medical examination without abnormal findings: Secondary | ICD-10-CM

## 2022-04-08 DIAGNOSIS — Z1211 Encounter for screening for malignant neoplasm of colon: Secondary | ICD-10-CM

## 2022-04-08 DIAGNOSIS — Z125 Encounter for screening for malignant neoplasm of prostate: Secondary | ICD-10-CM

## 2022-04-08 DIAGNOSIS — I10 Essential (primary) hypertension: Secondary | ICD-10-CM

## 2022-04-08 MED ORDER — ALBUTEROL SULFATE HFA 108 (90 BASE) MCG/ACT IN AERS: 1.0000 | INHALATION_SPRAY | Freq: Four times a day (QID) | RESPIRATORY_TRACT | 1 refills | Status: DC | PRN

## 2022-04-08 MED ORDER — PREDNISONE 10 MG PO TABS: ORAL_TABLET | ORAL | 0 refills | Status: DC

## 2022-04-08 MED ORDER — DOXYCYCLINE HYCLATE 100 MG PO TABS: 100.0000 mg | ORAL_TABLET | Freq: Two times a day (BID) | ORAL | 0 refills | Status: DC

## 2022-04-08 NOTE — Progress Notes (Signed)
Subjective:    Patient ID: Colin Watson, male    DOB: 1960/09/17, 62 y.o.   MRN: [id number]  DOS:  04/08/2022 Type of visit - description: CPX  Here for CPX. About 2 weeks ago started with increased respiratory symptoms: More cough than usual, more chest congestion, + clear sputum, increased wheezing. 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, f/u at [facility]     Nasal polyp     Past Surgical History:  Procedure Laterality Date   cyst excision,wrist     Social History   Socioeconomic History   Marital status: Married    Spouse name: Not on file   Number of children: 2   Years of education: 16   Highest education level: Not on file  Occupational History   Occupation: Optometrist for a local Co  Tobacco Use   Smoking status: Every Day    Packs/day: 1.00    Years: 45.00    Total pack years: 45.00    Types: Cigarettes   Smokeless tobacco: Current   Tobacco comments:    Down to 1-2 cigarettes daily    Still chews     On Wellbutrin   Vaping Use   Vaping Use: Never used  Substance and Sexual Activity   Alcohol use: Yes    Alcohol/week: 6.0 standard drinks of alcohol    Types: 6 Cans of beer per week   Drug use: No   Sexual activity: Yes    Partners: Female  Other Topics Concern   Not on file  Social History Narrative   HSG, [employer]. Married '82. 1 daughter, 1 son. Work - Optometrist    Marriage in good health      Lives at home with wife   Right handed   Caffeine: at  least 4 cups/day   Social Determinants of Health   Financial Resource Strain: Not on file  Food Insecurity: Not on file  Transportation Needs: Not on file  Physical Activity: Not on file  Stress: Not on file  Social Connections: Not on file  Intimate Partner Violence: Not on file    Current Outpatient Medications  Medication Instructions   albuterol (VENTOLIN HFA) 108 (90 Base) MCG/ACT inhaler 1-2 puffs, Inhalation, Every 6 hours PRN   amLODipine (NORVASC) 10 mg, Oral, Daily   buPROPion (WELLBUTRIN) 100 mg, Oral, 2 times daily   carvedilol (COREG) 25 mg, Oral, 2 times daily with meals   doxycycline (VIBRA-TABS) 100 mg, Oral, 2 times daily   fluticasone (FLONASE) 50 MCG/ACT nasal spray 2 sprays, Each Nare, Daily   losartan-hydrochlorothiazide (HYZAAR) 100-12.5 MG tablet 1 tablet, Oral, Daily   pravastatin (PRAVACHOL) 20 mg, Oral, Daily at bedtime   predniSONE (DELTASONE) 10 MG tablet 4 tablets x 2 days, 3 tabs x 2 days, 2 tabs x 2 days, 1 tab x 2 days   tadalafil (CIALIS) 20 mg, Oral, Every 48 hours PRN   umeclidinium-vilanterol (ANORO ELLIPTA)  62.5-25 MCG/ACT AEPB 1 puff, Inhalation, Daily       Objective:   Physical Exam BP 126/76   Pulse 81   Temp 98 F (36.7 C) (Oral)   Resp 16   Ht '5\' 11"'$  (1.803 m)   Wt 250 lb 4 oz (113.5 kg)   SpO2 97%   BMI 34.90 kg/m  General: Well developed, NAD, BMI noted Neck: No  thyromegaly  HEENT:  Normocephalic . Face symmetric, atraumatic Lungs:  + Wheezing bilaterally, large airway congestion and increased expiratory time.  No respiratory distress. Heart: RRR,  no murmur.  Abdomen:  Not distended, soft, non-tender. No rebound or rigidity.   Lower extremities: no pretibial edema bilaterally  Skin: Exposed areas without rash. Not pale. Not jaundice Neurologic:  alert & oriented X3.  Speech normal, gait appropriate for age and unassisted Strength symmetric and appropriate for age.  Psych: Cognition and judgment appear intact.   Cooperative with normal attention span and concentration.  Behavior appropriate. No anxious or depressed appearing.     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  Very good BP today, no change, checking labs Dyslipidemia: See LOV, has some aches with atorvastatin, based on LDL of 92 and CV RF for 15% I prescribed Pravachol.  Good compliance. Checking labs now COPD with exacerbation: As described above, rx prednisone, doxycycline.  Call if not better.  Once he is better we will proceed with PFTs.  Also noted that he has CT lung cancer screening next month. Continue Anoro Ellipta, RF albuterol RTC 4 months   In addition to CPX, I managed his chronic medical problems and an acute problem, COPD exacerbation

## 2022-04-08 NOTE — Assessment & Plan Note (Signed)
Here for CPX Hypertension: On amlodipine, carvedilol, Hyzaar.  Very good BP today, no change, checking labs Dyslipidemia: See LOV, has some aches with atorvastatin, based on LDL of 92 and CV RF for 15% I prescribed Pravachol.  Good compliance. Checking labs now COPD with exacerbation: As described above, rx prednisone, doxycycline.  Call if not better.  Once he is better we will proceed with PFTs.  Also noted that he has CT lung cancer screening next month. Continue Anoro Ellipta, RF albuterol RTC 4 months

## 2022-04-08 NOTE — Patient Instructions (Addendum)
Vaccines I recommend: Shingrix, COVID, RSV, pneumonia shot, flu shot  You have emphysema exacerbation: Continue with your routine inhalers. Prednisone for few days Doxycycline for 1 week. Get Robitussin DM or Mucinex DM, take as recommended by the box. Call if not gradually better. Call anytime if severe symptoms.  Next month, you will be due for CT of your chest.  I also planning to do "pulmonary function test".   Check the  blood pressure regularly BP GOAL is between 110/65 and  135/85. If it is consistently higher or lower, let me know    GO TO THE LAB : Get the blood work     [place], [place] back for checkup in 4 months    "Gordon of attorney" ,  "Living will" (Advance care planning documents)  If you already have a living will or healthcare power of attorney, is recommended you bring the copy to be scanned in your chart.   The document will be available to all the doctors you see in the system.  Advance care planning is a process that supports adults in  understanding and sharing their preferences regarding future medical care.  The patient's preferences are recorded in documents called Advance Directives and the can be modified at any time while the patient is in full mental capacity.   If you don't have one, please consider create one.      More information at: [email or web address]

## 2022-04-08 NOTE — Assessment & Plan Note (Addendum)
-  Td  09/2016 - shingrex  , covid, RSV, PNM 20, flu shot: Discussed, encouraged to proceed. -Prostate cancer screening: No FH, no symptoms, check PSA -CCS: Cscope 04-2016, polyps, 5 years, ready to proceed, referred to GI. -Lung cancer screening: Next CT lung cancer screening next month -Labs: BMP AST ALT FLP CBC PSA -Diet and exercise discussed -Tobacco: On Wellbutrin, smoking very little, maybe 1 or 2 cigarettes a day.  Still chewing but is ready to completely stop smoking.  Praised

## 2022-04-09 LAB — CBC WITH DIFFERENTIAL/PLATELET
Basophils Absolute: 0.1 10*3/uL (ref 0.0–0.1)
Basophils Relative: 1 % (ref 0.0–3.0)
Eosinophils Absolute: 1.1 10*3/uL — ABNORMAL HIGH (ref 0.0–0.7)
Eosinophils Relative: 12.3 % — ABNORMAL HIGH (ref 0.0–5.0)
HCT: 39.8 % (ref 39.0–52.0)
Hemoglobin: 13.5 g/dL (ref 13.0–17.0)
Lymphocytes Relative: 23.4 % (ref 12.0–46.0)
Lymphs Abs: 2.1 10*3/uL (ref 0.7–4.0)
MCHC: 33.9 g/dL (ref 30.0–36.0)
MCV: 94.2 fl (ref 78.0–100.0)
Monocytes Absolute: 0.8 10*3/uL (ref 0.1–1.0)
Monocytes Relative: 9.4 % (ref 3.0–12.0)
Neutro Abs: 4.8 10*3/uL (ref 1.4–7.7)
Neutrophils Relative %: 53.9 % (ref 43.0–77.0)
Platelets: 357 10*3/uL (ref 150.0–400.0)
RBC: 4.23 Mil/uL (ref 4.22–5.81)
RDW: 12.9 % (ref 11.5–15.5)
WBC: 8.9 10*3/uL (ref 4.0–10.5)

## 2022-04-09 LAB — PSA: PSA: 0.55 ng/mL (ref 0.10–4.00)

## 2022-04-09 LAB — BASIC METABOLIC PANEL
BUN: 14 mg/dL (ref 6–23)
CO2: 28 mEq/L (ref 19–32)
Calcium: 9.7 mg/dL (ref 8.4–10.5)
Chloride: 97 mEq/L (ref 96–112)
Creatinine, Ser: 0.8 mg/dL (ref 0.40–1.50)
GFR: 94.25 mL/min (ref >60.00)
Glucose, Bld: 84 mg/dL (ref 70–99)
Potassium: 4.6 mEq/L (ref 3.5–5.1)
Sodium: 135 mEq/L (ref 135–145)

## 2022-04-09 LAB — LIPID PANEL
Cholesterol: 180 mg/dL (ref 0–200)
HDL: 65.1 mg/dL (ref >39.00)
LDL Cholesterol: 79 mg/dL (ref 0–99)
NonHDL: 114.69
Total CHOL/HDL Ratio: 3
Triglycerides: 176 mg/dL — ABNORMAL HIGH (ref 0.0–149.0)
VLDL: 35.2 mg/dL (ref 0.0–40.0)

## 2022-04-09 LAB — ALT: ALT: 22 U/L (ref 0–53)

## 2022-04-09 LAB — AST: AST: 14 U/L (ref 0–37)

## 2022-04-16 ENCOUNTER — Other Ambulatory Visit: Payer: Self-pay

## 2022-04-16 ENCOUNTER — Encounter: Payer: Self-pay | Admitting: Internal Medicine

## 2022-04-16 DIAGNOSIS — J449 Chronic obstructive pulmonary disease, unspecified: Secondary | ICD-10-CM

## 2022-04-26 ENCOUNTER — Inpatient Hospital Stay: Admission: RE | Admit: 2022-04-26 | Payer: Managed Care, Other (non HMO) | Source: Ambulatory Visit

## 2022-04-30 ENCOUNTER — Ambulatory Visit
Admission: RE | Admit: 2022-04-30 | Discharge: 2022-04-30 | Disposition: A | Discharge status: Home / Self Care | Payer: Managed Care, Other (non HMO) | Source: Ambulatory Visit | Attending: Acute Care | Admitting: Acute Care

## 2022-04-30 DIAGNOSIS — Z87891 Personal history of nicotine dependence: Secondary | ICD-10-CM

## 2022-04-30 DIAGNOSIS — F17210 Nicotine dependence, cigarettes, uncomplicated: Secondary | ICD-10-CM

## 2022-05-02 ENCOUNTER — Other Ambulatory Visit: Payer: Self-pay

## 2022-05-02 DIAGNOSIS — Z87891 Personal history of nicotine dependence: Secondary | ICD-10-CM

## 2022-05-02 DIAGNOSIS — F17210 Nicotine dependence, cigarettes, uncomplicated: Secondary | ICD-10-CM

## 2022-06-07 ENCOUNTER — Ambulatory Visit
Admission: EM | Admit: 2022-06-07 | Discharge: 2022-06-07 | Disposition: A | Discharge status: Home / Self Care | Payer: Managed Care, Other (non HMO) | Attending: Urgent Care | Admitting: Urgent Care

## 2022-06-07 ENCOUNTER — Encounter: Payer: Self-pay | Admitting: Emergency Medicine

## 2022-06-07 ENCOUNTER — Ambulatory Visit: Payer: Managed Care, Other (non HMO)

## 2022-06-07 DIAGNOSIS — J449 Chronic obstructive pulmonary disease, unspecified: Secondary | ICD-10-CM | POA: Diagnosis not present

## 2022-06-07 DIAGNOSIS — J019 Acute sinusitis, unspecified: Secondary | ICD-10-CM | POA: Diagnosis not present

## 2022-06-07 DIAGNOSIS — B9789 Other viral agents as the cause of diseases classified elsewhere: Secondary | ICD-10-CM

## 2022-06-07 MED ORDER — PREDNISONE 20 MG PO TABS: ORAL_TABLET | ORAL | 0 refills | Status: DC

## 2022-06-07 MED ORDER — PROMETHAZINE-DM 6.25-15 MG/5ML PO SYRP: 5.0000 mL | ORAL_SOLUTION | Freq: Three times a day (TID) | ORAL | 0 refills | Status: DC | PRN

## 2022-06-07 MED ORDER — LEVOCETIRIZINE DIHYDROCHLORIDE 5 MG PO TABS: 5.0000 mg | ORAL_TABLET | Freq: Every evening | ORAL | 0 refills | Status: AC

## 2022-06-07 NOTE — ED Triage Notes (Signed)
Pt reports a cough, bilateral ear fullness and nasal congestion x 4 days. States believes he may have a sinus infection. Has tried taking Mucinex D and Mucinex DM.

## 2022-06-07 NOTE — ED Provider Notes (Signed)
[facility]  Note:  This document was prepared using Systems analyst and may include unintentional dictation errors.  MRN: [id number] DOB: 01/19/1961  Subjective:   Colin Watson is a 62 y.o. male presenting for 4 day history of acute onset sinus congestion, sinus drainage, chest congestion, mild shob. Has gotten relief from using Mucinex DM, Mucinex D. No history of asthma. Has emphysema, has cut back on his smoking. Last took doxycycline in January 2024 but can't recall how he did.   No current facility-administered medications for this encounter.  Current Outpatient Medications:    albuterol (VENTOLIN HFA) 108 (90 Base) MCG/ACT inhaler, Inhale 1-2 puffs into the lungs every 6 (six) hours as needed for wheezing or shortness of breath., Disp: 54 g, Rfl: 1   amLODipine (NORVASC) 10 MG tablet, Take 1 tablet (10 mg total) by mouth daily., Disp: 90 tablet, Rfl: 1   buPROPion (WELLBUTRIN) 100 MG tablet, Take 1 tablet (100 mg total) by mouth 2 (two) times daily., Disp: 180 tablet, Rfl: 1   carvedilol (COREG) 25 MG tablet, Take 1 tablet (25 mg total) by mouth 2 (two) times daily with a meal., Disp: 180 tablet, Rfl: 1   doxycycline (VIBRA-TABS) 100 MG tablet, Take 1 tablet (100 mg total) by mouth 2 (two) times daily., Disp: 15 tablet, Rfl: 0   fluticasone (FLONASE) 50 MCG/ACT nasal spray, Place 2 sprays into both nostrils daily., Disp: 16 g, Rfl: 5   losartan-hydrochlorothiazide (HYZAAR) 100-12.5 MG tablet, Take 1 tablet by mouth daily., Disp: 90 tablet, Rfl: 1   pravastatin (PRAVACHOL) 20 MG tablet, Take 1 tablet (20 mg total) by mouth at bedtime., Disp: 90 tablet, Rfl: 1   predniSONE (DELTASONE) 10 MG tablet, 4 tablets x 2 days, 3 tabs x 2 days, 2 tabs x 2 days, 1 tab x 2 days, Disp: 20 tablet, Rfl: 0   tadalafil (CIALIS) 20 MG tablet, Take 1 tablet (20 mg total) by mouth every other day as needed for erectile dysfunction., Disp: , Rfl:     umeclidinium-vilanterol (ANORO ELLIPTA) 62.5-25 MCG/ACT AEPB, Inhale 1 puff into the lungs daily at 6 (six) AM., Disp: 180 each, Rfl: 1   Allergies  Allergen Reactions   Ampicillin     Past Medical History:  Diagnosis Date   Erectile dysfunction    GERD (gastroesophageal reflux disease)    Hypertension 09/23/2013   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, f/u at [facility]     Nasal polyp      Past Surgical History:  Procedure Laterality Date   cyst excision,wrist      Family History  Problem Relation Age of Onset   COPD Father    Bone cancer Paternal Grandmother    Diabetes Neg Hx    Heart disease Neg Hx    Colon cancer Neg Hx    Prostate cancer Neg Hx     Social History   Tobacco Use   Smoking status: Every Day    Packs/day: 1.00    Years: 45.00    Additional pack years: 0.00    Total pack years: 45.00    Types: Cigarettes   Smokeless tobacco: Current   Tobacco comments:    Down to 1-2 cigarettes daily    Still chews     On Wellbutrin   Vaping Use   Vaping Use: Never used  Substance Use Topics   Alcohol use: Yes  Alcohol/week: 6.0 standard drinks of alcohol    Types: 6 Cans of beer per week   Drug use: No    ROS   Objective:   Vitals: BP (!) 147/90 (BP Location: Right Arm)   Pulse 76   Temp 98 F (36.7 C) (Oral)   Resp 18   SpO2 97%   Physical Exam Constitutional:      General: He is not in acute distress.    Appearance: Normal appearance. He is well-developed and normal weight. He is not ill-appearing, toxic-appearing or diaphoretic.  HENT:     Head: Normocephalic and atraumatic.     Right Ear: Tympanic membrane, ear canal and external ear normal. No drainage, swelling or tenderness. No middle ear effusion. There is no impacted cerumen. Tympanic membrane is not erythematous or bulging.     Left Ear: Tympanic membrane, ear canal and external ear normal. No drainage, swelling or tenderness.  No middle ear  effusion. There is no impacted cerumen. Tympanic membrane is not erythematous or bulging.     Nose: Nose normal. No congestion or rhinorrhea.     Mouth/Throat:     Mouth: Mucous membranes are moist.     Pharynx: No oropharyngeal exudate or posterior oropharyngeal erythema.  Eyes:     General: No scleral icterus.       Right eye: No discharge.        Left eye: No discharge.     Extraocular Movements: Extraocular movements intact.     Conjunctiva/sclera: Conjunctivae normal.  Cardiovascular:     Rate and Rhythm: Normal rate and regular rhythm.     Heart sounds: Normal heart sounds. No murmur heard.    No friction rub. No gallop.  Pulmonary:     Effort: Pulmonary effort is normal. No respiratory distress.     Breath sounds: Normal breath sounds. No stridor. No wheezing, rhonchi or rales.  Musculoskeletal:     Cervical back: Normal range of motion and neck supple. No rigidity. No muscular tenderness.  Neurological:     General: No focal deficit present.     Mental Status: He is alert and oriented to person, place, and time.  Psychiatric:        Mood and Affect: Mood normal.        Behavior: Behavior normal.        Thought Content: Thought content normal.     Assessment and Plan :   PDMP not reviewed this encounter.  1. Acute viral sinusitis   2. Chronic obstructive pulmonary disease, unspecified COPD type ([place])     Recommended an oral prednisone course in the context of his COPD and respiratory symptoms.  Discussed antibiotic stewardship.  Patient was agreeable. Deferred imaging given clear cardiopulmonary exam, hemodynamically stable vital signs. Counseled patient on potential for adverse effects with medications prescribed/recommended today, ER and return-to-clinic precautions discussed, patient verbalized understanding.  I did advise patient that if he continues to remain symptomatic without any relief on Sunday, he can call the clinic and I would be agreeable to using a round  of cefdinir to cover for secondary bacterial sinusitis.  Beyond that, he would have to have an office visit in clinic.   Jaynee Eagles, [place] 06/07/22 [phone]

## 2022-07-02 ENCOUNTER — Other Ambulatory Visit: Payer: Self-pay

## 2022-07-02 MED ORDER — PRAVASTATIN SODIUM 20 MG PO TABS: 20.0000 mg | ORAL_TABLET | Freq: Every day | ORAL | 1 refills | Status: DC

## 2022-07-22 ENCOUNTER — Other Ambulatory Visit: Payer: Self-pay | Admitting: Internal Medicine

## 2022-08-02 ENCOUNTER — Other Ambulatory Visit: Payer: Self-pay | Admitting: Internal Medicine

## 2022-08-02 DIAGNOSIS — I10 Essential (primary) hypertension: Secondary | ICD-10-CM

## 2022-08-09 ENCOUNTER — Ambulatory Visit: Payer: Managed Care, Other (non HMO) | Admitting: Internal Medicine

## 2022-08-26 ENCOUNTER — Other Ambulatory Visit: Payer: Self-pay | Admitting: Internal Medicine

## 2022-09-02 ENCOUNTER — Ambulatory Visit: Payer: Managed Care, Other (non HMO) | Admitting: Internal Medicine

## 2022-09-02 ENCOUNTER — Encounter: Payer: Self-pay | Admitting: Internal Medicine

## 2022-09-02 VITALS — BP 126/62 | HR 74 | Temp 98.4°F | Resp 18 | Ht 71.0 in | Wt 252.1 lb

## 2022-09-02 DIAGNOSIS — E782 Mixed hyperlipidemia: Secondary | ICD-10-CM

## 2022-09-02 DIAGNOSIS — M25551 Pain in right hip: Secondary | ICD-10-CM

## 2022-09-02 DIAGNOSIS — I10 Essential (primary) hypertension: Secondary | ICD-10-CM | POA: Diagnosis not present

## 2022-09-02 DIAGNOSIS — J449 Chronic obstructive pulmonary disease, unspecified: Secondary | ICD-10-CM | POA: Diagnosis not present

## 2022-09-02 DIAGNOSIS — E669 Obesity, unspecified: Secondary | ICD-10-CM

## 2022-09-02 DIAGNOSIS — M25552 Pain in left hip: Secondary | ICD-10-CM

## 2022-09-02 MED ORDER — UMECLIDINIUM-VILANTEROL 62.5-25 MCG/ACT IN AEPB: 1.0000 | INHALATION_SPRAY | Freq: Every day | RESPIRATORY_TRACT | 1 refills | Status: DC

## 2022-09-02 MED ORDER — BUPROPION HCL 100 MG PO TABS: 100.0000 mg | ORAL_TABLET | Freq: Two times a day (BID) | ORAL | 1 refills | Status: DC

## 2022-09-02 NOTE — Progress Notes (Unsigned)
Subjective:    Patient ID: Colin Watson, male    DOB: 12/12/1960, 62 y.o.   MRN: [id number]  DOS:  09/02/2022 Type of visit - description: f/u Multiple issues discussed.   COPD: Good med compliance, minimal cough, no quizzing, minimal DOE. Tobacco: Still smoking, much less. Complain of bilateral hip pain, at the lateral and anterior sides.  Denies any bulging or hernias at the groins.     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, f/u at [facility]     Nasal polyp     Past Surgical History:  Procedure Laterality Date   cyst excision,wrist      Current Outpatient Medications  Medication Instructions   albuterol (VENTOLIN HFA) 108 (90 Base) MCG/ACT inhaler 1-2 puffs, Inhalation, Every 6 hours PRN   amLODipine (NORVASC) 10 mg, Oral, Daily   buPROPion (WELLBUTRIN) 100 mg, Oral, 2 times daily   carvedilol (COREG) 25 mg, Oral, 2 times daily with meals   fluticasone (FLONASE) 50 MCG/ACT nasal spray 2 sprays, Each Nare, Daily   levocetirizine (XYZAL) 5 mg, Oral, Every evening   losartan-hydrochlorothiazide (HYZAAR) 100-12.5 MG tablet 1 tablet, Oral, Daily   pravastatin (PRAVACHOL) 20 mg, Oral, Daily at bedtime   predniSONE (DELTASONE) 20 MG tablet Take 2 tablets daily with breakfast.   promethazine-dextromethorphan (PROMETHAZINE-DM) 6.25-15 MG/5ML syrup 5 mLs, Oral, 3 times daily PRN   tadalafil (CIALIS) 20 mg, Oral, Every 48 hours PRN   umeclidinium-vilanterol (ANORO ELLIPTA) 62.5-25 MCG/ACT AEPB 1 puff, Inhalation, Daily       Objective:   Physical Exam BP 126/62   Pulse 74   Temp 98.4 F (36.9 C) (Oral)   Resp 18   Ht 5\' 11"  (1.803 m)   Wt 252 lb 2 oz (114.4 kg)   SpO2 97%   BMI 35.16 kg/m  General:   Well developed, NAD, BMI noted. HEENT:  Normocephalic . Face  symmetric, atraumatic Lungs:  CTA B Normal respiratory effort, no intercostal retractions, no accessory muscle use. Heart: RRR,  no murmur. Abdomen: No inguinal hernias Lower extremities: Hips: Normal rotation, no TTP at the trochanteric bursas.  On passive rotation he is not very flexible. No pretibial edema bilaterally  Skin: Not pale. Not jaundice Neurologic:  alert & oriented X3.  Speech normal, gait appropriate for age and unassisted Psych--  Cognition and judgment appear intact.  Cooperative with normal attention span and concentration.  Behavior appropriate. No anxious or depressed appearing.       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  Check BMP High cholesterol: Baseline LDL around 129, current LDL 79 on Pravachol 20 mg. Cardiovascular risk is still somewhat elevated at 14.1%.  We agreed on recheck his FLP, work closely on diet and exercise. Obesity: BMI 35, room for improvement on diet and exercise.  We had a very long conversation about diet, portion control, decrease carbohydrates.  He is interested in GLP-1, this was also discussed.  Reassess on RTC. COPD: Dx per CT 2019, at LOV had exacerbation, subsequently I attempted to schedule PFTs but that was not done. Currently on Anoro, albuterol as needed which he hardly ever uses.  Currently well-controlled.  Will arrange for PFTs that were not done at the last visit. Tobacco: Smoking very little, less than half pack a week, still dipping, sees the dentist routinely, refill Wellbutrin. Preventive care: Due for colonoscopy, encouraged to call GI. 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  Referral sent. RTC 4 months. The 10-year ASCVD risk score (Arnett DK, et al., 2019) is: 14.1%   Values used to calculate the score:     Age: 56 years     Sex: Male     Is Non-Hispanic African American: No     Diabetic: No     Tobacco smoker: Yes     Systolic Blood Pressure: 126 mmHg     Is BP treated: Yes     HDL Cholesterol: 65.1 mg/dL     Total Cholesterol: 180 mg/dL   1 Here for CPX Hypertension: On amlodipine, carvedilol, Hyzaar.  Very good BP today, no change, checking labs Dyslipidemia: See LOV, has some aches with atorvastatin, based on LDL of 92 and CV RF for 15% I prescribed Pravachol.  Good compliance. Checking labs now COPD with exacerbation: As described above, rx prednisone, doxycycline.  Call if not better.  Once he is better we will proceed with PFTs.  Also noted that he has CT lung cancer screening next month. Continue Anoro Ellipta, RF albuterol RTC 4 months   In addition to CPX, I manag

## 2022-09-02 NOTE — Patient Instructions (Signed)
Call the gastroenterology office and arrange for a colonoscopy.  [phone]  Call the pulmonary office and arrange for "pulmonary function test".  [phone]  Will refer you to a physical therapy to help you with your hip pain.  Watch your diet closely.    GO TO THE FRONT DESK, PLEASE SCHEDULE YOUR APPOINTMENTS Come back for   blood work at your earliest convenience, fasting  Come back for a checkup in 4 months

## 2022-09-03 NOTE — Assessment & Plan Note (Signed)
HTN: Ambulatory BPs typically in the 120s, continue amlodipine, carvedilol, Hyzaar.  Check BMP High cholesterol: Baseline LDL around 129, current LDL 79 on Pravachol 20 mg. Cardiovascular risk is still somewhat elevated at 14.1%.  We agreed on recheck his FLP, work closely on diet and exercise.  Further advised for results. Obesity: BMI 35, room for improvement on diet and exercise.  We had a very long conversation about diet, portion control, decrease carbohydrates intake.  He is interested in GLP-1, this was also discussed.  Reassess on RTC. COPD: Dx per CT 2019, at LOV had exacerbation. Currently on Anoro, albuterol as needed which he hardly ever uses.  Currently well-controlled.  Will arrange for PFTs that were not done at the last visit. Tobacco: Smoking very little, less than half pack a week, still dipping, sees the dentist routinely, refill Wellbutrin. Preventive care: Due for colonoscopy, encouraged to call GI. 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  Referral sent. RTC 4 months.

## 2022-09-06 ENCOUNTER — Other Ambulatory Visit (INDEPENDENT_AMBULATORY_CARE_PROVIDER_SITE_OTHER): Payer: Managed Care, Other (non HMO)

## 2022-09-06 DIAGNOSIS — E669 Obesity, unspecified: Secondary | ICD-10-CM | POA: Diagnosis not present

## 2022-09-06 DIAGNOSIS — I10 Essential (primary) hypertension: Secondary | ICD-10-CM | POA: Diagnosis not present

## 2022-09-06 DIAGNOSIS — E782 Mixed hyperlipidemia: Secondary | ICD-10-CM

## 2022-09-06 LAB — LIPID PANEL
Cholesterol: 160 mg/dL (ref 0–200)
HDL: 61 mg/dL (ref >39.00)
LDL Cholesterol: 82 mg/dL (ref 0–99)
NonHDL: 98.81
Total CHOL/HDL Ratio: 3
Triglycerides: 83 mg/dL (ref 0.0–149.0)
VLDL: 16.6 mg/dL (ref 0.0–40.0)

## 2022-09-06 LAB — BASIC METABOLIC PANEL
BUN: 11 mg/dL (ref 6–23)
CO2: 29 mEq/L (ref 19–32)
Calcium: 9.8 mg/dL (ref 8.4–10.5)
Chloride: 98 mEq/L (ref 96–112)
Creatinine, Ser: 0.76 mg/dL (ref 0.40–1.50)
GFR: 95.44 mL/min (ref >60.00)
Glucose, Bld: 102 mg/dL — ABNORMAL HIGH (ref 70–99)
Potassium: 4.8 mEq/L (ref 3.5–5.1)
Sodium: 134 mEq/L — ABNORMAL LOW (ref 135–145)

## 2022-09-06 LAB — HEMOGLOBIN A1C: Hgb A1c MFr Bld: 5.4 % (ref 4.6–6.5)

## 2022-09-09 MED ORDER — PRAVASTATIN SODIUM 40 MG PO TABS: 40.0000 mg | ORAL_TABLET | Freq: Every day | ORAL | 1 refills | Status: DC

## 2022-09-09 NOTE — Addendum Note (Signed)
Addended byConrad Richfield D on: 09/09/2022 12:09 PM   Modules accepted: Orders

## 2022-09-11 ENCOUNTER — Telehealth: Payer: Self-pay

## 2022-09-11 NOTE — Telephone Encounter (Signed)
Plan of care signed and faxed back to [facility] at [phone]. Form sent for scanning.  

## 2022-10-09 ENCOUNTER — Telehealth: Payer: Self-pay

## 2022-10-09 NOTE — Telephone Encounter (Signed)
Plan of care signed and faxed back to [facility] at [phone]. Form sent for scanning.  

## 2022-11-22 ENCOUNTER — Telehealth: Payer: Self-pay

## 2022-11-22 NOTE — Telephone Encounter (Signed)
Plan of care signed and faxed back to [facility] at [phone]. Form sent for scanning.

## 2023-01-06 ENCOUNTER — Ambulatory Visit: Payer: Managed Care, Other (non HMO) | Admitting: Internal Medicine

## 2023-01-06 ENCOUNTER — Encounter: Payer: Self-pay | Admitting: Internal Medicine

## 2023-01-06 VITALS — BP 124/80 | HR 60 | Temp 97.7°F | Resp 18 | Ht 71.0 in | Wt 259.5 lb

## 2023-01-06 DIAGNOSIS — I10 Essential (primary) hypertension: Secondary | ICD-10-CM | POA: Diagnosis not present

## 2023-01-06 DIAGNOSIS — J449 Chronic obstructive pulmonary disease, unspecified: Secondary | ICD-10-CM | POA: Insufficient documentation

## 2023-01-06 DIAGNOSIS — Z720 Tobacco use: Secondary | ICD-10-CM | POA: Diagnosis not present

## 2023-01-06 MED ORDER — BUPROPION HCL 100 MG PO TABS: 100.0000 mg | ORAL_TABLET | Freq: Two times a day (BID) | ORAL | 1 refills | Status: DC

## 2023-01-06 MED ORDER — PRAVASTATIN SODIUM 40 MG PO TABS
40.0000 mg | ORAL_TABLET | Freq: Every day | ORAL | 1 refills | Status: DC
Start: 2023-01-06 — End: 2023-10-10

## 2023-01-06 NOTE — Patient Instructions (Addendum)
Vaccines I recommend: Shingrix (shingles) RSV vaccine Covid vaccine Flu shot    Check the  blood pressure regularly Blood pressure goal:  between 110/65 and  135/85. If it is consistently higher or lower, let me know      Next visit with me by 03/2023 for a physical exam Please schedule it at the front desk

## 2023-01-06 NOTE — Assessment & Plan Note (Signed)
High cholesterol: Based on last FLP, Pravachol increased to 40 mg, for some reason he could not get the prescription  so started atorvastatin from his wife (10 mg?) previously he was not able to tolerate atorvastatin 20 mg. Plan: New prescription for Pravachol 40 mg sent, recheck labs on RTC HTN: BP looks very good on current meds, last BMP okay. COPD: Essentially asymptomatic on Anoro, has not needed albuterol lately. Tobacco: Extensive discussion, smokes 2 to 3 cigarettes at night, using some nicotine supplements.  Continue Wellbutrin, RF sent Preventive care: Vaccine advice provided, pros> cons. RTC 03-2023 CPX

## 2023-01-06 NOTE — Progress Notes (Signed)
   Subjective:    Patient ID: Colin Watson, male    DOB: 1960-12-10, 62 y.o.   MRN: [id number]  DOS:  01/06/2023 Type of visit - description: ROV  Medical problems addressed. For some reason, he was not able to get Pravachol 40 mg. Self started atorvastatin.   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, f/u at [facility]     Nasal polyp     Past Surgical History:  Procedure Laterality Date   cyst excision,wrist      Current Outpatient Medications  Medication Instructions   albuterol (VENTOLIN HFA) 108 (90 Base) MCG/ACT inhaler 1-2 puffs, Inhalation, Every 6 hours PRN   amLODipine (NORVASC) 10 mg, Oral, Daily   buPROPion (WELLBUTRIN) 100 mg, Oral, 2 times daily   carvedilol (COREG) 25 mg, Oral, 2 times daily with meals   fluticasone (FLONASE) 50 MCG/ACT nasal spray 2 sprays, Each Nare, Daily   levocetirizine (XYZAL) 5 mg, Oral, Every evening   losartan-hydrochlorothiazide (HYZAAR) 100-12.5 MG tablet 1 tablet, Oral, Daily   pravastatin (PRAVACHOL) 40 mg, Oral, Daily at bedtime   tadalafil (CIALIS) 20 mg, Oral, Every 48 hours PRN   umeclidinium-vilanterol (ANORO ELLIPTA) 62.5-25 MCG/ACT AEPB 1 puff, Inhalation, Daily       Objective:   Physical Exam BP 124/80   Pulse 60   Temp 97.7 F (36.5 C) (Oral)   Resp 18   Ht 5\' 11"  (1.803 m)   Wt 259 lb 8 oz (117.7 kg)   SpO2 96%   BMI 36.19 kg/m  General:   Well developed, NAD, BMI noted. HEENT:  Normocephalic . Face symmetric, atraumatic Lungs:  CTA B Normal respiratory effort, no intercostal retractions, no accessory muscle use. Heart: RRR,  no murmur.  Lower extremities: no pretibial edema bilaterally  Skin: Not pale. Not jaundice Neurologic:  alert & oriented X3.  Speech normal, gait appropriate for age and  unassisted Psych--  Cognition and judgment appear intact.  Cooperative with normal attention span and concentration.  Behavior appropriate. No anxious or depressed appearing.      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 Plan: New prescription for Pravachol 40 mg sent, recheck labs on RTC HTN: BP looks very good on current meds, last BMP okay. COPD: Essentially asymptomatic on Anoro, has not needed albuterol lately. Tobacco: Extensive discussion, smokes 2 to 3 cigarettes at night, using some nicotine supplements.  Continue Wellbutrin, RF sent Preventive care: Vaccine advice provided, pros> cons. RTC 03-2023 CPX

## 2023-01-20 ENCOUNTER — Other Ambulatory Visit: Payer: Self-pay | Admitting: Internal Medicine

## 2023-01-29 ENCOUNTER — Other Ambulatory Visit: Payer: Self-pay | Admitting: Internal Medicine

## 2023-01-29 DIAGNOSIS — I10 Essential (primary) hypertension: Secondary | ICD-10-CM

## 2023-04-03 ENCOUNTER — Telehealth: Payer: Self-pay

## 2023-04-03 NOTE — Telephone Encounter (Signed)
LMOM asking for call back to reschedule visit 04/04/23- provider sick

## 2023-04-04 ENCOUNTER — Encounter: Payer: Managed Care, Other (non HMO) | Admitting: Internal Medicine

## 2023-04-10 ENCOUNTER — Encounter: Payer: Self-pay | Admitting: Acute Care

## 2023-05-02 ENCOUNTER — Other Ambulatory Visit: Payer: Managed Care, Other (non HMO)

## 2023-05-07 ENCOUNTER — Other Ambulatory Visit: Payer: Self-pay | Admitting: Internal Medicine

## 2023-05-12 ENCOUNTER — Other Ambulatory Visit: Payer: Self-pay | Admitting: Internal Medicine

## 2023-05-12 MED ORDER — UMECLIDINIUM-VILANTEROL 62.5-25 MCG/ACT IN AEPB: 1.0000 | INHALATION_SPRAY | Freq: Every day | RESPIRATORY_TRACT | 1 refills | Status: DC

## 2023-05-12 NOTE — Telephone Encounter (Signed)
 Copied from CRM [id number]. Topic: Clinical - Medication Refill >> May 12, 2023 11:51 AM Almira Coaster wrote: Most Recent Primary Care Visit:  Provider: Wanda Plump  Department: [facility]  Visit Type: OFFICE VISIT  Date: 01/06/2023  Medication: umeclidinium-vilanterol (ANORO ELLIPTA) 62.5-25 MCG/ACT AEPB   Has the patient contacted their pharmacy? No (Agent: If no, request that the patient contact the pharmacy for the refill. If patient does not wish to contact the pharmacy document the reason why and proceed with request.) (Agent: If yes, when and what did the pharmacy advise?)  Is this the correct pharmacy for this prescription? Yes If no, delete pharmacy and type the correct one.  This is the patient's preferred pharmacy:  [id number] [place], [place] - [address] [place] [address] Phone: [phone] Fax: [phone]   Has the prescription been filled recently? No  Is the patient out of the medication? No  Has the patient been seen for an appointment in the last year OR does the patient have an upcoming appointment? Yes  Can we respond through MyChart? Yes  Agent: Please be advised that Rx refills may take up to 3 business days. We ask that you follow-up with your pharmacy.

## 2023-05-13 ENCOUNTER — Encounter: Payer: Managed Care, Other (non HMO) | Admitting: Internal Medicine

## 2023-05-13 ENCOUNTER — Other Ambulatory Visit: Payer: Self-pay

## 2023-05-13 MED ORDER — BUPROPION HCL 100 MG PO TABS: 100.0000 mg | ORAL_TABLET | Freq: Two times a day (BID) | ORAL | 1 refills | Status: DC

## 2023-06-10 ENCOUNTER — Encounter: Payer: Self-pay | Admitting: Internal Medicine

## 2023-06-10 ENCOUNTER — Ambulatory Visit (INDEPENDENT_AMBULATORY_CARE_PROVIDER_SITE_OTHER): Admitting: Internal Medicine

## 2023-06-10 VITALS — BP 136/84 | HR 73 | Temp 98.2°F | Resp 16 | Ht 71.0 in | Wt 262.1 lb

## 2023-06-10 DIAGNOSIS — Z0000 Encounter for general adult medical examination without abnormal findings: Secondary | ICD-10-CM | POA: Diagnosis not present

## 2023-06-10 DIAGNOSIS — R0609 Other forms of dyspnea: Secondary | ICD-10-CM | POA: Diagnosis not present

## 2023-06-10 DIAGNOSIS — I10 Essential (primary) hypertension: Secondary | ICD-10-CM | POA: Diagnosis not present

## 2023-06-10 DIAGNOSIS — E782 Mixed hyperlipidemia: Secondary | ICD-10-CM

## 2023-06-10 DIAGNOSIS — Z1211 Encounter for screening for malignant neoplasm of colon: Secondary | ICD-10-CM

## 2023-06-10 DIAGNOSIS — Z0001 Encounter for general adult medical examination with abnormal findings: Secondary | ICD-10-CM

## 2023-06-10 DIAGNOSIS — G4733 Obstructive sleep apnea (adult) (pediatric): Secondary | ICD-10-CM

## 2023-06-10 DIAGNOSIS — F17200 Nicotine dependence, unspecified, uncomplicated: Secondary | ICD-10-CM

## 2023-06-10 NOTE — Assessment & Plan Note (Addendum)
 Here for CPX -Td  09/2016 -Vaccines I recommend: Shingrex  , covid, PNM 20, flu shot: pros>cons -Prostate cancer screening: No FH, no sxs, check PSA -CCS: Cscope 04-2016, polyps, 5 years.  Overdue, plans to call GI. -Lung cancer screening: Next CT lung cancer screening due.  Referral -Labs: CMP FLP CBC PSA -Diet and exercise discussed -Tobacco: On Wellbutrin, not smoking, still chewing tobacco.

## 2023-06-10 NOTE — Assessment & Plan Note (Signed)
 Here for CPX  We also discussed the following UJW:JXBJ compliance with amlodipine, carvedilol, Hyzaar.  No recent ambulatory BPs, BP today is okay.  Minimal peri-ankle edema, observation for now. EKG today:, No acute changes, no change from previous DOE: Reports mild DOE when he tries to [employer].  No chest pain.  Most likely a combination of   deconditioning, weight gain, COPD. Patient agrees and likes to start a gradual exercise program, to call immediately as chest pain or palpitations.  Reassess in 4 months.    COPD: On Anoro, minimal cough if any. OSA: Severe, not using a CPAP, explained importance of good CPAP compliance to decrease cardiovascular risk.  He will try again. RTC 4 months

## 2023-06-10 NOTE — Patient Instructions (Addendum)
 Please call gastroenterology and set up your next colonoscopy. [phone].  We are referring you to for a CT (lung cancer screening)  Vaccines I recommend: Prevnar 20 Shingrix (shingles) Flu shot every fall COVID booster  Restart using your CPAP nightly  Sinus rhythm Check the  blood pressure regularly Blood pressure goal:  between 110/65 and  135/85. If it is consistently higher or lower, let me know     GO TO THE LAB : Get the blood work     Please go to the front desk: Arrange for a follow-up in 4 months

## 2023-06-10 NOTE — Progress Notes (Signed)
 Subjective:    Patient ID: Colin Watson, male    DOB: [birth year], 63 y.o.   MRN: [id number]  DOS:  06/10/2023 Type of visit - description: cpx  Here for CPX Reports good medication compliance. He has no problems with ADLs but if he if he tries to do more he gets slightly short of breath. Denies chest pain or palpitations. No major problems with cough. Admits to mild peri-ankle edema typically at the end of the day.   Wt Readings from Last 3 Encounters:  06/10/23 262 lb 2 oz (118.9 kg)  01/06/23 259 lb 8 oz (117.7 kg)  09/02/22 252 lb 2 oz (114.4 kg)     Review of Systems  Other than above, a 14 point review of systems is negative      Past Medical History:  Diagnosis Date   Erectile dysfunction    GERD (gastroesophageal reflux disease)    Hypertension 09/23/2013   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, f/u at [facility]     Nasal polyp     Past Surgical History:  Procedure Laterality Date   cyst excision,wrist     Social History   Socioeconomic History   Marital status: Married    Spouse name: Not on file   Number of children: 2   Years of education: 16   Highest education level: Not on file  Occupational History   Occupation: Airline pilot for a local [employer]  Tobacco Use   Smoking status: Every Day    Current packs/day: 1.00    Average packs/day: 1 pack/day for 45.0 years (45.0 ttl pk-yrs)    Types: Cigarettes   Smokeless tobacco: Current   Tobacco comments:    Down to 1-2 cigarettes daily    Still chews     On Wellbutrin   Vaping Use   Vaping status: Never Used  Substance and Sexual Activity   Alcohol use: Yes    Alcohol/week: 6.0 standard drinks of alcohol    Types: 6 Cans of beer per week   Drug use: No   Sexual activity: Yes    Partners: Female  Other Topics Concern   Not on file  Social History Narrative   [employer] - BA accounting. Married '82. 1 daughter, 1 son. Work - Airline pilot    Marriage in  good health      Lives at home with wife   Right handed   Caffeine: at least 4 cups/day   Social Drivers of Corporate investment banker Strain: Not on file  Food Insecurity: Not on file  Transportation Needs: Not on file  Physical Activity: Not on file  Stress: Not on file  Social Connections: Not on file  Intimate Partner Violence: Not on file     Current Outpatient Medications  Medication Instructions   albuterol (VENTOLIN HFA) 108 (90 Base) MCG/ACT inhaler 1-2 puffs, Inhalation, Every 6 hours PRN   amLODipine (NORVASC) 10 mg, Oral, Daily   buPROPion (WELLBUTRIN) 100 mg, Oral, 2 times daily   carvedilol (COREG) 25 mg, Oral, 2 times daily with meals   fluticasone (FLONASE) 50 MCG/ACT nasal spray 2 sprays, Each Nare, Daily   levocetirizine (XYZAL) 5 mg, Oral, Every evening   losartan-hydrochlorothiazide (HYZAAR) 100-12.5 MG tablet 1 tablet, Oral, Daily   pravastatin (PRAVACHOL) 40 mg, Oral, Daily   tadalafil (CIALIS) 20 mg, Every 48 hours PRN   umeclidinium-vilanterol (ANORO ELLIPTA) 62.5-25 MCG/ACT AEPB 1 puff, Inhalation,  Daily       Objective:   Physical Exam BP 136/84   Pulse 73   Temp 98.2 F (36.8 C) (Oral)   Resp 16   Ht 5\' 11"  (1.803 m)   Wt 262 lb 2 oz (118.9 kg)   SpO2 98%   BMI 36.56 kg/m  General: Well developed, NAD, BMI noted Neck: No  thyromegaly  HEENT:  Normocephalic . Face symmetric, atraumatic Lungs:  CTA B Normal respiratory effort, no intercostal retractions, no accessory muscle use. Heart: RRR,  no murmur.  Abdomen:  Not distended, soft, non-tender. No rebound or rigidity.   Lower extremities: no pretibial edema bilaterally.  Trace PERI ankle edema Skin: Exposed areas without rash. Not pale. Not jaundice Neurologic:  alert & oriented X3.  Speech normal, gait appropriate for age and unassisted Strength symmetric and appropriate for age.  Psych: Cognition and judgment appear intact.  Cooperative with normal attention span and  concentration.  Behavior appropriate. No anxious or depressed appearing.     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  Overdue, plans to call GI. -Lung cancer screening: Next CT lung cancer screening due.  Referral -Labs: CMP FLP CBC PSA -Diet and exercise discussed -Tobacco: On Wellbutrin, not smoking, still chewing tobacco. We also discussed the following ZOX:WRUE compliance with amlodipine, carvedilol, Hyzaar.  No recent ambulatory BPs, BP today is okay.  Minimal peri-ankle edema, observation for now. EKG today:, No acute changes, no change from previous DOE: Reports mild DOE when he tries to [employer].  No chest pain.  Most likely a combination of   deconditioning, weight gain, COPD. Patient agrees and likes to start a gradual exercise program, to call immediately as chest pain or palpitations.  Reassess in 4 months.    COPD: On Anoro, minimal cough if any. OSA: Severe, not using a CPAP, explained importance of good CPAP compliance to decrease cardiovascular risk.  He will try again. RTC 4 months

## 2023-06-11 LAB — LIPID PANEL
Cholesterol: 180 mg/dL (ref 0–200)
HDL: 64.5 mg/dL (ref >39.00)
LDL Cholesterol: 90 mg/dL (ref 0–99)
NonHDL: 115.39
Total CHOL/HDL Ratio: 3
Triglycerides: 126 mg/dL (ref 0.0–149.0)
VLDL: 25.2 mg/dL (ref 0.0–40.0)

## 2023-06-11 LAB — CBC WITH DIFFERENTIAL/PLATELET
Basophils Absolute: 0.1 10*3/uL (ref 0.0–0.1)
Basophils Relative: 1.1 % (ref 0.0–3.0)
Eosinophils Absolute: 0.5 10*3/uL (ref 0.0–0.7)
Eosinophils Relative: 5.8 % — ABNORMAL HIGH (ref 0.0–5.0)
HCT: 40.8 % (ref 39.0–52.0)
Hemoglobin: 13.7 g/dL (ref 13.0–17.0)
Lymphocytes Relative: 25.5 % (ref 12.0–46.0)
Lymphs Abs: 2.1 10*3/uL (ref 0.7–4.0)
MCHC: 33.6 g/dL (ref 30.0–36.0)
MCV: 95 fl (ref 78.0–100.0)
Monocytes Absolute: 0.9 10*3/uL (ref 0.1–1.0)
Monocytes Relative: 11.1 % (ref 3.0–12.0)
Neutro Abs: 4.7 10*3/uL (ref 1.4–7.7)
Neutrophils Relative %: 56.5 % (ref 43.0–77.0)
Platelets: 319 10*3/uL (ref 150.0–400.0)
RBC: 4.29 Mil/uL (ref 4.22–5.81)
RDW: 13.6 % (ref 11.5–15.5)
WBC: 8.3 10*3/uL (ref 4.0–10.5)

## 2023-06-11 LAB — PSA: PSA: 0.53 ng/mL (ref 0.10–4.00)

## 2023-06-11 LAB — COMPREHENSIVE METABOLIC PANEL WITH GFR
ALT: 19 U/L (ref 0–53)
AST: 13 U/L (ref 0–37)
Albumin: 4.5 g/dL (ref 3.5–5.2)
Alkaline Phosphatase: 96 U/L (ref 39–117)
BUN: 10 mg/dL (ref 6–23)
CO2: 28 meq/L (ref 19–32)
Calcium: 9.8 mg/dL (ref 8.4–10.5)
Chloride: 99 meq/L (ref 96–112)
Creatinine, Ser: 0.88 mg/dL (ref 0.40–1.50)
GFR: 90.82 mL/min (ref >60.00)
Glucose, Bld: 90 mg/dL (ref 70–99)
Potassium: 4.7 meq/L (ref 3.5–5.1)
Sodium: 136 meq/L (ref 135–145)
Total Bilirubin: 0.6 mg/dL (ref 0.2–1.2)
Total Protein: 6.7 g/dL (ref 6.0–8.3)

## 2023-06-12 ENCOUNTER — Encounter: Payer: Self-pay | Admitting: Internal Medicine

## 2023-07-11 ENCOUNTER — Ambulatory Visit
Admission: EM | Admit: 2023-07-11 | Discharge: 2023-07-11 | Disposition: A | Discharge status: Home / Self Care | Attending: Physician Assistant | Admitting: Physician Assistant

## 2023-07-11 ENCOUNTER — Other Ambulatory Visit: Payer: Self-pay

## 2023-07-11 DIAGNOSIS — S80861A Insect bite (nonvenomous), right lower leg, initial encounter: Secondary | ICD-10-CM

## 2023-07-11 DIAGNOSIS — W57XXXA Bitten or stung by nonvenomous insect and other nonvenomous arthropods, initial encounter: Secondary | ICD-10-CM

## 2023-07-11 MED ORDER — DOXYCYCLINE HYCLATE 100 MG PO CAPS: 200.0000 mg | ORAL_CAPSULE | Freq: Every day | ORAL | 0 refills | Status: DC

## 2023-07-11 NOTE — ED Triage Notes (Signed)
 Pt states here to get tick removed from RLE. Pt states he thought it was a scab a few days ago and he put ointment on it and it wouldn't go away. Pt states he looked at it close and saw it was a tick. Pt has an area in RLE where it's erythematous and looks like something is in the middle of it.

## 2023-07-11 NOTE — Discharge Instructions (Addendum)
 Follow up with PCP or return if you have symptoms of lyme disease

## 2023-07-11 NOTE — ED Provider Notes (Signed)
 [facility]    CSN: [id number] Arrival date & time: 07/11/23  7829      History   Chief Complaint No chief complaint on file.   HPI Colin Watson is a 63 y.o. male.   Patient here for evaluation of "tick bite" R LE.  Bitten by tick, tick still embedded.  He has been unable to remove it.    Past Medical History:  Diagnosis Date   Erectile dysfunction    GERD (gastroesophageal reflux disease)    Hypertension 09/23/2013   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, f/u at [facility]     Nasal polyp     Patient Active Problem List   Diagnosis Date Noted   Chronic obstructive pulmonary disease (HCC) 01/06/2023   OSA on CPAP 04/01/2020   PCP NOTES >>>>> 02/22/2015   Tobacco abuse 11/29/2013   GERD (gastroesophageal reflux disease) 09/23/2013   Hypertension 09/23/2013   Annual physical exam 12/03/2011   ED (erectile dysfunction) 12/03/2011   MENINGIOMA 09/01/2008   NASAL POLYP 09/01/2008    Past Surgical History:  Procedure Laterality Date   cyst excision,wrist         Home Medications    Prior to Admission medications   Medication Sig Start Date End Date Taking? Authorizing Provider  doxycycline  (VIBRAMYCIN ) 100 MG capsule Take 2 capsules (200 mg total) by mouth daily. 07/11/23  Yes Lavonia Powers, PA-C  albuterol  (VENTOLIN  HFA) 108 (90 Base) MCG/ACT inhaler Inhale 1-2 puffs into the lungs every 6 (six) hours as needed for wheezing or shortness of breath. Patient not taking: Reported on 06/10/2023 04/08/22   Ezell Hollow, MD  amLODipine  (NORVASC ) 10 MG tablet TAKE 1 TABLET BY MOUTH EVERY DAY 05/07/23   Paz, Jose E, MD  buPROPion  (WELLBUTRIN ) 100 MG tablet Take 1 tablet (100 mg total) by mouth 2 (two) times daily. 05/13/23   Ezell Hollow, MD  carvedilol  (COREG ) 25 MG tablet Take 1 tablet (25 mg total) by mouth 2 (two) times daily with a meal. 01/29/23   Ezell Hollow, MD  fluticasone  (FLONASE ) 50 MCG/ACT nasal spray Place 2 sprays  into both nostrils daily. 05/04/18   Paz, Jose E, MD  levocetirizine (XYZAL ) 5 MG tablet Take 1 tablet (5 mg total) by mouth every evening. 06/07/22   Adolph Hoop, PA-C  losartan -hydrochlorothiazide (HYZAAR) 100-12.5 MG tablet TAKE 1 TABLET BY MOUTH EVERY DAY 05/07/23   Paz, Jose E, MD  pravastatin  (PRAVACHOL ) 40 MG tablet Take 1 tablet (40 mg total) by mouth daily. 01/06/23   Ezell Hollow, MD  tadalafil  (CIALIS ) 20 MG tablet Take 1 tablet (20 mg total) by mouth every other day as needed for erectile dysfunction. 12/18/18   Ezell Hollow, MD  umeclidinium-vilanterol (ANORO ELLIPTA ) 62.5-25 MCG/ACT AEPB Inhale 1 puff into the lungs daily at 6 (six) AM. 05/12/23   Ezell Hollow, MD    Family History Family History  Problem Relation Age of Onset   COPD Father    Bone cancer Paternal Grandmother    Diabetes Neg Hx    Heart disease Neg Hx    Colon cancer Neg Hx    Prostate cancer Neg Hx     Social History Social History   Tobacco Use   Smoking status: Every Day    Types: Cigarettes   Smokeless tobacco: Current   Tobacco comments:    Down to 1-2 cigarettes daily    Still  chews     On Wellbutrin    Vaping Use   Vaping status: Never Used  Substance Use Topics   Alcohol use: Yes    Alcohol/week: 6.0 standard drinks of alcohol    Types: 6 Cans of beer per week   Drug use: No     Allergies   Ampicillin   Review of Systems Review of Systems  Constitutional:  Negative for chills, fatigue and fever.  Gastrointestinal:  Negative for nausea and vomiting.  Musculoskeletal:  Negative for arthralgias and myalgias.  Skin:  Positive for color change and wound.  Hematological:  Negative for adenopathy. Does not bruise/bleed easily.  Psychiatric/Behavioral:  Negative for sleep disturbance.      Physical Exam Triage Vital Signs ED Triage Vitals  Encounter Vitals Group     BP 07/11/23 0949 (!) 146/89     Systolic BP Percentile --      Diastolic BP Percentile --      Pulse Rate 07/11/23 0949  70     Resp 07/11/23 0949 17     Temp 07/11/23 0949 98.2 F (36.8 C)     Temp Source 07/11/23 0949 Oral     SpO2 07/11/23 0949 95 %     Weight --      Height --      Head Circumference --      Peak Flow --      Pain Score 07/11/23 0947 0     Pain Loc --      Pain Education --      Exclude from Growth Chart --    No data found.  Updated Vital Signs BP (!) 146/89   Pulse 70   Temp 98.2 F (36.8 C) (Oral)   Resp 17   SpO2 95%   Visual Acuity Right Eye Distance:   Left Eye Distance:   Bilateral Distance:    Right Eye Near:   Left Eye Near:    Bilateral Near:     Physical Exam Vitals and nursing note reviewed.  Constitutional:      Appearance: Normal appearance. He is well-developed.  HENT:     Head: Normocephalic and atraumatic.     Nose: Nose normal.     Mouth/Throat:     Mouth: Mucous membranes are moist.  Eyes:     General: No scleral icterus.    Conjunctiva/sclera: Conjunctivae normal.  Pulmonary:     Effort: Pulmonary effort is normal. No respiratory distress.  Musculoskeletal:     Cervical back: Normal range of motion and neck supple. No rigidity.     Comments: Tick embedded lateral aspect R lower leg, 3 - 4 mm halo erythema.  Tick mildly engorged  Skin:    General: Skin is warm and dry.     Capillary Refill: Capillary refill takes less than 2 seconds.  Neurological:     General: No focal deficit present.     Mental Status: He is alert and oriented to person, place, and time.     Gait: Gait normal.  Psychiatric:        Mood and Affect: Mood normal.      UC Treatments / Results  Labs (all labs ordered are listed, but only abnormal results are displayed) Labs Reviewed - No data to display  EKG   Radiology No results found.  Procedures Procedures (including critical care time)  Medications Ordered in UC Medications - No data to display  Initial Impression / Assessment and Plan / UC Course  I  have reviewed the triage vital signs and the  nursing notes.  Pertinent labs & imaging results that were available during my care of the patient were reviewed by me and considered in my medical decision making (see chart for details).     Tick removed intact Will do prophylactic dose of doxycycline  Return PRN Final Clinical Impressions(s) / UC Diagnoses   Final diagnoses:  Tick bite of right lower leg, initial encounter     Discharge Instructions      Follow up with PCP or return if you have symptoms of lyme disease   ED Prescriptions     Medication Sig Dispense Auth. Provider   doxycycline  (VIBRAMYCIN ) 100 MG capsule Take 2 capsules (200 mg total) by mouth daily. 2 capsule Lavonia Powers, PA-C      PDMP not reviewed this encounter.   Lavonia Powers, PA-C 07/11/23 1028

## 2023-07-16 ENCOUNTER — Ambulatory Visit: Admitting: Internal Medicine

## 2023-07-16 VITALS — BP 128/78 | HR 72 | Temp 98.1°F | Resp 18 | Ht 71.0 in | Wt 261.2 lb

## 2023-07-16 DIAGNOSIS — W57XXXD Bitten or stung by nonvenomous insect and other nonvenomous arthropods, subsequent encounter: Secondary | ICD-10-CM

## 2023-07-16 DIAGNOSIS — S80861D Insect bite (nonvenomous), right lower leg, subsequent encounter: Secondary | ICD-10-CM | POA: Diagnosis not present

## 2023-07-16 NOTE — Progress Notes (Unsigned)
 Subjective:    Patient ID: Colin Watson, male    DOB: 19-Jun-1960, 63 y.o.   MRN: [id number]  DOS:  07/16/2023 Type of visit - description: Acute, follow-up fatigue diet  [place] near [facility] , noted a tick bite approximately 07/06/2023. Slightly engorged?Shelah Derry to the urgent care 07/11/2023, take removed intact, Rx doxycycline  prophylactically  He is concerned about Lyme disease and RMSF.  Currently with no symptoms such as fever, aches pains.  The area where the tick was removed was slightly swollen, red, is looking better but is still somewhat red. 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, f/u at [facility]     Nasal polyp     Past Surgical History:  Procedure Laterality Date   cyst excision,wrist      Current Outpatient Medications  Medication Instructions   albuterol  (VENTOLIN  HFA) 108 (90 Base) MCG/ACT inhaler 1-2 puffs, Inhalation, Every 6 hours PRN   amLODipine  (NORVASC ) 10 mg, Oral, Daily   buPROPion  (WELLBUTRIN ) 100 mg, Oral, 2 times daily   carvedilol  (COREG ) 25 mg, Oral, 2 times daily with meals   doxycycline  (VIBRAMYCIN ) 200 mg, Oral, Daily   fluticasone  (FLONASE ) 50 MCG/ACT nasal spray 2 sprays, Each Nare, Daily   levocetirizine (XYZAL ) 5 mg, Oral, Every evening   losartan -hydrochlorothiazide (HYZAAR) 100-12.5 MG tablet 1 tablet, Oral, Daily   pravastatin  (PRAVACHOL ) 40 mg, Oral, Daily   tadalafil  (CIALIS ) 20 mg, Every 48 hours PRN   umeclidinium-vilanterol (ANORO ELLIPTA ) 62.5-25 MCG/ACT AEPB 1 puff, Inhalation, Daily       Objective:   Physical Exam BP 128/78 (BP Location: Left Arm, Patient Position: Sitting, Cuff Size: Large)   Pulse 72   Temp 98.1 F (36.7 C) (Oral)   Resp 18   Ht 5\' 11"  (1.803 m)   Wt 261 lb 3.2 oz (118.5  kg)   SpO2 99%   BMI 36.43 kg/m  General:   Well developed, NAD, BMI noted. HEENT:  Normocephalic . Face symmetric, atraumatic R leg pretibial area: See picture Neurologic:  alert & oriented X3.  Speech normal, gait appropriate for age and unassisted Psych--  Cognition and judgment appear intact.  Cooperative with normal attention span and concentration.  Behavior appropriate. No anxious or depressed appearing.      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  Low risk for tickborne diseases, recommend to watch for fever, chills, rash, unusual arthralgias. Also monitor for alpha-gal symptoms such as hives after eating meat.  Patient verbalized understanding.  See AVS.   4-1 Here for CPX -Td  09/2016 -Vaccines I recommend: Shingrex  , covid, PNM 20, flu shot: pros>cons -Prostate cancer screening: No FH, no sxs, check PSA -CCS: Cscope 04-2016, polyps, 5 years.  Overdue, plans to call GI. -Lung cancer screening: Next CT lung cancer screening due.  Referral -Labs: CMP FLP CBC PSA -  Diet and exercise discussed -Tobacco: On Wellbutrin , not smoking, still chewing tobacco. We also discussed the following GNF:AOZH compliance with amlodipine , carvedilol , Hyzaar.  No recent ambulatory BPs, BP today is okay.  Minimal peri-ankle edema, observation for now. EKG today:, No acute changes, no change from previous DOE: Reports mild DOE when he tries to [employer].  No chest pain.  Most likely a combination of   deconditioning, weight gain, COPD. Patient agrees and likes to start a gradual exercise program, to call immediately as chest  pain or palpitations.  Reassess in 4 months.    COPD: On Anoro, minimal cough if any. OSA: Severe, not using a CPAP, explained importance of good CPAP compliance to decrease cardiovascular risk.  He will try again. RTC 4 months

## 2023-07-16 NOTE — Patient Instructions (Signed)
 Apply over-the-counter hydrocortisone cream 1% twice daily until better  If you start experiencing any unusual aches pains, fever, rash in the next few weeks: Let us  know  Watch for meat allergies (hives)

## 2023-07-17 NOTE — Assessment & Plan Note (Signed)
 Tick bite: Removed at a UC, got a round of doxycycline  empirically, currently asymptomatic.  Low risk for tickborne diseases, recommend to watch for fever, chills, rash, unusual arthralgias. Also monitor for alpha-gal symptoms such as hives after eating meat.  Patient verbalized understanding.  See AVS.

## 2023-08-20 ENCOUNTER — Other Ambulatory Visit: Payer: Self-pay | Admitting: Internal Medicine

## 2023-08-20 ENCOUNTER — Encounter: Payer: Self-pay | Admitting: Internal Medicine

## 2023-08-31 ENCOUNTER — Other Ambulatory Visit: Payer: Self-pay | Admitting: Internal Medicine

## 2023-10-10 ENCOUNTER — Other Ambulatory Visit: Payer: Self-pay | Admitting: Internal Medicine

## 2023-10-10 DIAGNOSIS — I10 Essential (primary) hypertension: Secondary | ICD-10-CM

## 2023-10-14 ENCOUNTER — Ambulatory Visit: Admitting: Internal Medicine

## 2023-10-31 ENCOUNTER — Ambulatory Visit: Admitting: Internal Medicine

## 2023-11-23 ENCOUNTER — Other Ambulatory Visit: Payer: Self-pay | Admitting: Internal Medicine

## 2023-11-29 ENCOUNTER — Other Ambulatory Visit: Payer: Self-pay | Admitting: Internal Medicine

## 2023-12-26 ENCOUNTER — Other Ambulatory Visit: Payer: Self-pay | Admitting: Internal Medicine

## 2024-01-05 ENCOUNTER — Other Ambulatory Visit: Payer: Self-pay | Admitting: Internal Medicine

## 2024-01-05 ENCOUNTER — Ambulatory Visit: Admitting: Internal Medicine

## 2024-01-05 DIAGNOSIS — F17200 Nicotine dependence, unspecified, uncomplicated: Secondary | ICD-10-CM

## 2024-01-05 DIAGNOSIS — G4733 Obstructive sleep apnea (adult) (pediatric): Secondary | ICD-10-CM | POA: Diagnosis not present

## 2024-01-05 DIAGNOSIS — I10 Essential (primary) hypertension: Secondary | ICD-10-CM | POA: Diagnosis not present

## 2024-01-05 DIAGNOSIS — E782 Mixed hyperlipidemia: Secondary | ICD-10-CM

## 2024-01-05 DIAGNOSIS — J449 Chronic obstructive pulmonary disease, unspecified: Secondary | ICD-10-CM

## 2024-01-05 DIAGNOSIS — Z1211 Encounter for screening for malignant neoplasm of colon: Secondary | ICD-10-CM

## 2024-01-05 MED ORDER — WIXELA INHUB 250-50 MCG/ACT IN AEPB: 1.0000 | INHALATION_SPRAY | Freq: Two times a day (BID) | RESPIRATORY_TRACT | 6 refills | Status: AC

## 2024-01-05 MED ORDER — ZEPBOUND 2.5 MG/0.5ML ~~LOC~~ SOAJ: 2.5000 mg | SUBCUTANEOUS | 0 refills | Status: DC

## 2024-01-05 NOTE — Patient Instructions (Addendum)
   Go to the front desk for the checkout Please make an appointment for blood work at your earliest convenience Arrange an appointment to see me in 4 months  Call gastroenterology to set up your colonoscopy [phone]  We are referring you to neurology, Dr. Chalice to talk about sleep apnea   Vaccines I recommend: Flu shot COVID booster   We are referring you to another CT of the chest for lung cancer screening   Check the  blood pressure regularly Blood pressure goal:  between 110/65 and  135/85. If it is consistently higher or lower, let me know     YOUR PLAN:  NUMBNESS IN ARMS DURING SLEEP: You are experiencing numbness in your arms during sleep, likely due to your sleeping position. -Try adjusting your sleeping position to see if it helps reduce the numbness.  OBSTRUCTIVE SLEEP APNEA: You have severe obstructive sleep apnea and are having trouble using your CPAP machine due to mask discomfort. -We will refer you to neurology for reevaluation and perhaps an alternative CPAP mask options that may be more comfortable for you.  CHRONIC OBSTRUCTIVE PULMONARY DISEASE (COPD): Your COPD is currently well-managed, but you experience some shortness of breath and have concerns about the cost of your current medication, Anoro. -We will switch your medication from Anoro to WIXELA to help manage costs.  OBESITY, BMI 36: You have a BMI of 36, which qualifies you for GLP-1 medication to assist with weight loss. -We will prescribe Zepbound 2.5 mg once a week for a month. -It is important to adhere to a healthy diet to support your weight loss efforts. - Try for 4 weeks and let us  know how that is working for you.  HYPERTENSION: Your blood pressure is well-controlled with your current medications. -Continue taking your current antihypertensive medications: amlodipine , carvedilol , and Hyzaar.  HYPERLIPIDEMIA: Your cholesterol levels are well-controlled with pravastatin . -Continue  taking pravastatin  as prescribed.

## 2024-01-05 NOTE — Progress Notes (Signed)
 Subjective:    Patient ID: Colin Watson, male    DOB: February 25, 1961, 63 y.o.   MRN: [id number]  DOS:  01/05/2024  Follow-up  Discussed the use of AI scribe software for clinical note transcription with the patient, who gave verbal consent to proceed.  History of Present Illness Nocturnal upper extremity paresthesia - Numbness in both arms during sleep, awakening him from sleep - Sensation described as similar to a limb 'falling asleep' - Affects most of the arm, occurs on either side - No associated pain or neck pain  Chronic obstructive pulmonary disease (copd) symptoms - Uses Anoro once daily for COPD management - Experiences shortness of breath more quickly than twenty years ago - No significant cough or wheezing  Obstructive sleep apnea and cpap intolerance - Diagnosed with sleep apnea - Struggles with CPAP use due to mask discomfort, attributed to being a side sleeper - Unable to use CPAP effectively  Morbid obesity - BMI of 36 - Previously lost weight on the Atkins diet - Expresses desire to lose weight     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, f/u at [facility]     Nasal polyp     Past Surgical History:  Procedure Laterality Date   cyst excision,wrist      Current Outpatient Medications  Medication Instructions   albuterol  (VENTOLIN  HFA) 108 (90 Base) MCG/ACT inhaler 1-2 puffs, Inhalation, Every 6 hours PRN   amLODipine  (NORVASC ) 10 mg, Oral, Daily   ANORO ELLIPTA  62.5-25 MCG/ACT AEPB INHALE 1 PUFF INTO THE LUNGS DAILY AT 6 (SIX) AM.   buPROPion  (WELLBUTRIN ) 100 mg, Oral, 2 times daily, Needs appt   carvedilol  (COREG ) 25 mg, Oral, 2 times daily with meals   fluticasone  (FLONASE ) 50 MCG/ACT nasal spray 2 sprays, Each Nare, Daily   levocetirizine (XYZAL ) 5  mg, Oral, Every evening   losartan -hydrochlorothiazide (HYZAAR) 100-12.5 MG tablet 1 tablet, Oral, Daily   pravastatin  (PRAVACHOL ) 40 mg, Oral, Daily   tadalafil  (CIALIS ) 20 mg, Every 48 hours PRN       Objective:   Physical Exam BP 128/70   Pulse 74   Temp 98.2 F (36.8 C) (Oral)   Resp 18   Ht 5' 11 (1.803 m)   Wt 260 lb (117.9 kg)   SpO2 98%   BMI 36.26 kg/m  General:   Well developed, NAD, BMI noted. HEENT:  Normocephalic . Face symmetric, atraumatic Lungs:  CTA B Normal respiratory effort, no intercostal retractions, no accessory muscle use. Heart: RRR,  no murmur.  Lower extremities: no pretibial edema bilaterally  Skin: Not pale. Not jaundice Neurologic:  alert & oriented X3.  Speech normal, gait appropriate for age and unassisted Psych--  Cognition and judgment appear intact.  Cooperative with normal attention span and concentration.  Behavior appropriate. No anxious or depressed appearing.      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 No major neck pain or concerning symptoms.  Observation OSA Severe obstructive sleep apnea with CPAP discomfort. Discussed alternative mask options for side sleepers. Refer to pulmonary specialist, for evaluation and discuss CPAP mask options. COPD: COPD well-controlled.  Anoro effective but costly. Willing to try other medications, switch to [place] (apparently preferred medication per his insurance).  Morbid obesity ---BMI 36, OSA, high cholesterol Morbid obesity with BMI of 36. Qualifies for GLP-1 medication. Discussed side effects, insurance issues, cost and importance of dietary  changes. - Prescribe Zepbound 2.5 mg once a week for a month. - Encourage adherence to a healthy diet. HTN Well-controlled with current medication regimen.  Continue amlodipine , carvedilol , and Hyzaar. Hyperlipidemia: Continue pravastatin . Preventive care: Declined vaccine, due for LDCT.  Due for colonoscopy.  Referrals sent RTC 4 months  Time spent 40 minutes.  Multiple issues addressed, changed medication for COPD reviewing insurance alternatives, advised patient about GLP-1's, explaining side effects, cost. Also address his sleep apnea, importance of CPAP compliance.

## 2024-01-05 NOTE — Assessment & Plan Note (Signed)
 Numbness in arms during sleep Likely due to sleeping position. No major neck pain or concerning symptoms.  Observation OSA Severe obstructive sleep apnea with CPAP discomfort. Discussed alternative mask options for side sleepers. Refer to pulmonary specialist, for evaluation and discuss CPAP mask options. COPD: COPD well-controlled.  Anoro effective but costly. Willing to try other medications, switch to [place] (apparently preferred medication per his insurance).  Morbid obesity ---BMI 36, OSA, high cholesterol Morbid obesity with BMI of 36. Qualifies for GLP-1 medication. Discussed side effects, insurance issues, cost and importance of dietary changes. - Prescribe Zepbound 2.5 mg once a week for a month. - Encourage adherence to a healthy diet. HTN Well-controlled with current medication regimen.  Continue amlodipine , carvedilol , and Hyzaar. Hyperlipidemia: Continue pravastatin . Preventive care: Declined vaccine, due for LDCT.  Due for colonoscopy.  Referrals sent RTC 4 months

## 2024-01-06 ENCOUNTER — Other Ambulatory Visit (HOSPITAL_COMMUNITY): Payer: Self-pay

## 2024-01-06 ENCOUNTER — Telehealth: Payer: Self-pay

## 2024-01-06 ENCOUNTER — Telehealth: Payer: Self-pay | Admitting: Internal Medicine

## 2024-01-06 ENCOUNTER — Other Ambulatory Visit: Payer: Self-pay | Admitting: Internal Medicine

## 2024-01-06 NOTE — Telephone Encounter (Signed)
 Pharmacy Patient Advocate Encounter   Received notification from RX Request Messages that prior authorization for Zepbound 2.5mg /0.11ml is required/requested.   Insurance verification completed.   The patient is insured through [facility].   Per test claim: PA required; PA submitted to above mentioned insurance via Latent Key/confirmation #/EOC [id number] Status is pending

## 2024-01-06 NOTE — Telephone Encounter (Signed)
 Copied from CRM [id number]. Topic: General - Other >> Jan 06, 2024  3:46 PM Jasmin G wrote: Reason for CRM: Staff from [facility] called to get clarification on medical diagnosis. Paperwork will be faxed over.

## 2024-01-06 NOTE — Telephone Encounter (Signed)
 Pharmacy Patient Advocate Encounter  Received notification from [facility] that Prior Authorization for Zepbound 2.5mg /0.61ml has been DENIED.  See denial reason below. No denial letter attached in CMM. Will attach denial letter to Media tab once received.   PA #/Case ID/Reference #: [id number]    Placed a call to the insurance at [phone] for redetermination due to the patient has a diagnosis of OSA.   Case ID: [id number]

## 2024-01-06 NOTE — Telephone Encounter (Signed)
 Not enough information given. Error CRM sent. Needing return call back number, name of staff calling and reference number to call.

## 2024-01-06 NOTE — Telephone Encounter (Signed)
 Needs PA for OSA please

## 2024-01-07 ENCOUNTER — Other Ambulatory Visit: Payer: Self-pay | Admitting: Internal Medicine

## 2024-01-07 ENCOUNTER — Other Ambulatory Visit (HOSPITAL_COMMUNITY): Payer: Self-pay

## 2024-01-07 ENCOUNTER — Other Ambulatory Visit (INDEPENDENT_AMBULATORY_CARE_PROVIDER_SITE_OTHER)

## 2024-01-07 DIAGNOSIS — I10 Essential (primary) hypertension: Secondary | ICD-10-CM | POA: Diagnosis not present

## 2024-01-07 LAB — BASIC METABOLIC PANEL WITH GFR
BUN: 8 mg/dL (ref 6–23)
CO2: 27 meq/L (ref 19–32)
Calcium: 9.2 mg/dL (ref 8.4–10.5)
Chloride: 96 meq/L (ref 96–112)
Creatinine, Ser: 0.77 mg/dL (ref 0.40–1.50)
GFR: 94.18 mL/min (ref >60.00)
Glucose, Bld: 105 mg/dL — ABNORMAL HIGH (ref 70–99)
Potassium: 4.8 meq/L (ref 3.5–5.1)
Sodium: 131 meq/L — ABNORMAL LOW (ref 135–145)

## 2024-01-07 NOTE — Telephone Encounter (Signed)
 Pharmacy Patient Advocate Encounter  Received notification from [facility] that Prior Authorization for Zepbound 2.5mg /0.64ml has been APPROVED from 12/10/23 to 03/10/25. Ran test claim, Copay is $374.04. This test claim was processed through [facility]- copay amounts may vary at other pharmacies due to pharmacy/plan contracts, or as the patient moves through the different stages of their insurance plan.   PA #/Case ID/Reference #: [id number]

## 2024-01-07 NOTE — Telephone Encounter (Signed)
 PA approved. Effective 12/10/23 to 03/10/2025.

## 2024-01-07 NOTE — Telephone Encounter (Signed)
 Colin Watson   01/07/2024  8:53 AM  Call ID [phone]. Derrick with [employer], Medicare coverage determination department, working on formulary request for patient's zepbound and wanted to confirm diagnosis. Specialist did not get call back number but caller ID shows [phone]. There was no reference number giving by caller. Resolving as [id number] error. Thank you for your submission.

## 2024-01-08 ENCOUNTER — Ambulatory Visit: Payer: Self-pay | Admitting: Internal Medicine

## 2024-01-09 ENCOUNTER — Telehealth: Payer: Self-pay | Admitting: Internal Medicine

## 2024-01-09 NOTE — Telephone Encounter (Signed)
 Copied from CRM #[id number]. Topic: Clinical - Medication Question >> Jan 09, 2024 11:28 AM Adelita E wrote: Reason for CRM: Patient called in stating that his pharmacy has been reaching out to PCP office in regards to some questions that they had about patient's Zepbound and if there are any alternatives for the patient to take. Patient would like to speak with PCP's nurse about this matter, thank you.

## 2024-01-09 NOTE — Telephone Encounter (Signed)
 [facility]- made them aware of PA approval for Zepbound, they will reach out to Pt to let him know the cost and see if he wants them to order it.

## 2024-01-12 DIAGNOSIS — D320 Benign neoplasm of cerebral meninges: Secondary | ICD-10-CM | POA: Diagnosis not present

## 2024-01-12 DIAGNOSIS — D329 Benign neoplasm of meninges, unspecified: Secondary | ICD-10-CM | POA: Diagnosis not present

## 2024-01-12 DIAGNOSIS — Z86018 Personal history of other benign neoplasm: Secondary | ICD-10-CM | POA: Diagnosis not present

## 2024-01-23 ENCOUNTER — Other Ambulatory Visit: Payer: Self-pay | Admitting: Internal Medicine

## 2024-01-26 ENCOUNTER — Other Ambulatory Visit: Payer: Self-pay | Admitting: Internal Medicine

## 2024-02-22 ENCOUNTER — Other Ambulatory Visit: Payer: Self-pay | Admitting: Internal Medicine

## 2024-03-09 ENCOUNTER — Other Ambulatory Visit: Payer: Self-pay | Admitting: Internal Medicine

## 2024-03-09 NOTE — Telephone Encounter (Signed)
 Please ask patient if he is tolerating well. Okay to increase Zepbound  to 5 mg weekly if he is doing well

## 2024-03-09 NOTE — Telephone Encounter (Signed)
Would you like Pt to increase to '5mg'$ ?

## 2024-03-10 NOTE — Telephone Encounter (Signed)
 Patient states he is tolerating the 2.5 mg well and has not had any issues and is okay with increasing to 5MG . Current weigh loss 4-6 lbs

## 2024-03-16 ENCOUNTER — Other Ambulatory Visit: Payer: Self-pay | Admitting: Internal Medicine

## 2024-03-17 ENCOUNTER — Telehealth: Payer: Self-pay

## 2024-03-17 MED ORDER — ZEPBOUND 5 MG/0.5ML ~~LOC~~ SOAJ: 5.0000 mg | SUBCUTANEOUS | 1 refills | Status: AC

## 2024-03-17 NOTE — Telephone Encounter (Signed)
 Copied from CRM #[id number]. Topic: Clinical - Prescription Issue >> Mar 17, 2024 10:14 AM Zy'onna H wrote: Reason for CRM: Patient stated that he called last week inquiring about his latest Rx refill request for: tirzepatide  (ZEPBOUND ) 5 MG/0.5ML Pen His pharmacy informed him the Rx had been denied, the patient is reaching out to find out why it was denied and what can we do to get everything back on track.

## 2024-03-17 NOTE — Telephone Encounter (Signed)
 Pt notified that the 5mg  was sent in this morning.  Advised that we had to deny the 2.5mg  to send in the 5mg .

## 2024-03-26 ENCOUNTER — Other Ambulatory Visit: Payer: Self-pay | Admitting: Internal Medicine

## 2024-05-07 ENCOUNTER — Ambulatory Visit: Admitting: Internal Medicine
